# Patient Record
Sex: Female | Born: 1970
Health system: Southern US, Community
[De-identification: ages and names within clinical notes are randomized; demographics above are authoritative.]

## PROBLEM LIST (undated history)

## (undated) DIAGNOSIS — D219 Benign neoplasm of connective and other soft tissue, unspecified: Secondary | ICD-10-CM

## (undated) HISTORY — DX: Benign neoplasm of connective and other soft tissue, unspecified: D21.9

---

## 1994-02-10 HISTORY — PX: TOE SURGERY: SHX1073

## 2000-04-16 ENCOUNTER — Encounter: Admission: RE | Admit: 2000-04-16 | Discharge: 2000-05-26 | Payer: Self-pay | Admitting: *Deleted

## 2000-04-24 ENCOUNTER — Other Ambulatory Visit: Admission: RE | Admit: 2000-04-24 | Discharge: 2000-04-24 | Payer: Self-pay | Admitting: Gynecology

## 2001-03-03 ENCOUNTER — Encounter: Admission: RE | Admit: 2001-03-03 | Discharge: 2001-03-03 | Payer: Self-pay | Admitting: Gynecology

## 2001-03-21 ENCOUNTER — Encounter: Payer: Self-pay | Admitting: Gynecology

## 2001-03-21 ENCOUNTER — Observation Stay (HOSPITAL_COMMUNITY): Admission: AD | Admit: 2001-03-21 | Discharge: 2001-03-22 | Payer: Self-pay | Admitting: *Deleted

## 2001-05-26 ENCOUNTER — Inpatient Hospital Stay (HOSPITAL_COMMUNITY): Admission: AD | Admit: 2001-05-26 | Discharge: 2001-05-28 | Payer: Self-pay | Admitting: *Deleted

## 2001-07-08 ENCOUNTER — Other Ambulatory Visit: Admission: RE | Admit: 2001-07-08 | Discharge: 2001-07-08 | Payer: Self-pay | Admitting: *Deleted

## 2002-05-09 ENCOUNTER — Encounter: Admission: RE | Admit: 2002-05-09 | Discharge: 2002-05-09 | Payer: Self-pay | Admitting: Family Medicine

## 2002-05-09 ENCOUNTER — Encounter: Payer: Self-pay | Admitting: Family Medicine

## 2002-08-17 ENCOUNTER — Other Ambulatory Visit: Admission: RE | Admit: 2002-08-17 | Discharge: 2002-08-17 | Payer: Self-pay | Admitting: Obstetrics and Gynecology

## 2003-08-30 ENCOUNTER — Other Ambulatory Visit: Admission: RE | Admit: 2003-08-30 | Discharge: 2003-08-30 | Payer: Self-pay | Admitting: Gynecology

## 2004-02-27 ENCOUNTER — Encounter: Admission: RE | Admit: 2004-02-27 | Discharge: 2004-02-27 | Payer: Self-pay | Admitting: Family Medicine

## 2005-01-17 ENCOUNTER — Other Ambulatory Visit: Admission: RE | Admit: 2005-01-17 | Discharge: 2005-01-17 | Payer: Self-pay | Admitting: Gynecology

## 2006-01-20 ENCOUNTER — Other Ambulatory Visit: Admission: RE | Admit: 2006-01-20 | Discharge: 2006-01-20 | Payer: Self-pay | Admitting: Gynecology

## 2007-04-16 ENCOUNTER — Other Ambulatory Visit: Admission: RE | Admit: 2007-04-16 | Discharge: 2007-04-16 | Payer: Self-pay | Admitting: Gynecology

## 2008-04-21 ENCOUNTER — Other Ambulatory Visit: Admission: RE | Admit: 2008-04-21 | Discharge: 2008-04-21 | Payer: Self-pay | Admitting: Gynecology

## 2008-04-21 ENCOUNTER — Ambulatory Visit: Payer: Self-pay | Admitting: Women's Health

## 2008-04-21 ENCOUNTER — Encounter: Payer: Self-pay | Admitting: Women's Health

## 2008-04-24 ENCOUNTER — Ambulatory Visit: Payer: Self-pay | Admitting: Women's Health

## 2008-05-29 ENCOUNTER — Ambulatory Visit: Payer: Self-pay | Admitting: Women's Health

## 2008-05-30 ENCOUNTER — Ambulatory Visit: Payer: Self-pay | Admitting: Gynecology

## 2008-06-27 ENCOUNTER — Ambulatory Visit: Payer: Self-pay | Admitting: Gynecology

## 2009-04-25 ENCOUNTER — Other Ambulatory Visit: Admission: RE | Admit: 2009-04-25 | Discharge: 2009-04-25 | Payer: Self-pay | Admitting: Gynecology

## 2009-04-25 ENCOUNTER — Ambulatory Visit: Payer: Self-pay | Admitting: Women's Health

## 2009-10-11 ENCOUNTER — Ambulatory Visit: Payer: Self-pay | Admitting: Women's Health

## 2010-01-24 ENCOUNTER — Ambulatory Visit: Payer: Self-pay | Admitting: Women's Health

## 2010-06-28 NOTE — Discharge Summary (Signed)
[facility]  Patient:    Kathryn Farley, Kathryn Farley Visit Number: [id number] MRN: [id number]          Service Type: OBS Location: [id number] Attending Physician:  Wetzel Bjornstad Dictated by:   Antony Contras, [facility] Admit Date:  05/26/2001 Discharge Date: 05/28/2001                             Discharge Summary  DISCHARGE DIAGNOSES:          1. Intrauterine pregnancy at 39 weeks.                               2. Meconium stained fluid.  PROCEDURE:                    Normal spontaneous vaginal delivery of viable infant over intact perineum.  HISTORY OF PRESENT ILLNESS:   The patient is a 40 year old, gravida 2, para 1-0-0-1, LMP August 22, 2000, [facility] May 30, 2001.  Prenatal risk factors include a history of left lower quadrant pain for which the patient did receive evaluation during current pregnancy.  PRENATAL LABORATORY DATA:     Blood type is B positive.  Antibody screen negative.  RPR, HBSAG, and HIV nonreactive.  MSAFP normal.  HOSPITAL COURSE:              The patient was admitted at 39 weeks with spontaneous onset of labor.  There was some meconium stained fluid.  Delivery was accomplished spontaneously.  She delivered an Apgars 7 and 9 female infant weighing 6 pounds 9 ounces over intact perineum.  No meconium noted below the cords.  Postpartum course, she remained afebrile and had no difficulty voiding.  She was able to be discharged in satisfactory condition on her second postpartum day.  CBC; hematocrit 29.8, hemoglobin 9.9, WBC 10.8, and platelets 212.  DISPOSITION:                  Follow up in six weeks, continue prenatal vitamins and iron.  Motrin and Tylox for pain. Dictated by:   Antony Contras, [facility] Attending Physician:  Wetzel Bjornstad DD:  06/14/01 TD:  06/16/01 Job: [id number]

## 2010-08-23 ENCOUNTER — Other Ambulatory Visit (HOSPITAL_COMMUNITY)
Admission: RE | Admit: 2010-08-23 | Discharge: 2010-08-23 | Disposition: A | Discharge status: Home / Self Care | Payer: BC Managed Care – PPO | Source: Ambulatory Visit | Attending: Obstetrics and Gynecology | Admitting: Obstetrics and Gynecology

## 2010-08-23 ENCOUNTER — Other Ambulatory Visit: Payer: Self-pay | Admitting: Women's Health

## 2010-08-23 ENCOUNTER — Encounter (INDEPENDENT_AMBULATORY_CARE_PROVIDER_SITE_OTHER): Payer: BC Managed Care – PPO | Admitting: Women's Health

## 2010-08-23 DIAGNOSIS — Z833 Family history of diabetes mellitus: Secondary | ICD-10-CM

## 2010-08-23 DIAGNOSIS — Z01419 Encounter for gynecological examination (general) (routine) without abnormal findings: Secondary | ICD-10-CM

## 2010-08-23 DIAGNOSIS — Z13220 Encounter for screening for lipoid disorders: Secondary | ICD-10-CM

## 2010-08-23 DIAGNOSIS — Z124 Encounter for screening for malignant neoplasm of cervix: Secondary | ICD-10-CM | POA: Insufficient documentation

## 2010-08-27 ENCOUNTER — Encounter: Payer: Self-pay | Admitting: *Deleted

## 2011-10-09 ENCOUNTER — Encounter: Payer: Self-pay | Admitting: Women's Health

## 2011-10-09 ENCOUNTER — Other Ambulatory Visit: Payer: Self-pay | Admitting: Gynecology

## 2011-10-09 ENCOUNTER — Ambulatory Visit (INDEPENDENT_AMBULATORY_CARE_PROVIDER_SITE_OTHER): Payer: BC Managed Care – PPO | Admitting: Women's Health

## 2011-10-09 VITALS — BP 108/68 | Ht 66.5 in | Wt 157.0 lb

## 2011-10-09 DIAGNOSIS — D219 Benign neoplasm of connective and other soft tissue, unspecified: Secondary | ICD-10-CM | POA: Insufficient documentation

## 2011-10-09 DIAGNOSIS — D259 Leiomyoma of uterus, unspecified: Secondary | ICD-10-CM

## 2011-10-09 DIAGNOSIS — Z01419 Encounter for gynecological examination (general) (routine) without abnormal findings: Secondary | ICD-10-CM

## 2011-10-09 DIAGNOSIS — N946 Dysmenorrhea, unspecified: Secondary | ICD-10-CM

## 2011-10-09 DIAGNOSIS — Z833 Family history of diabetes mellitus: Secondary | ICD-10-CM

## 2011-10-09 LAB — CBC WITH DIFFERENTIAL/PLATELET
Basophils Absolute: 0 10*3/uL (ref 0.0–0.1)
Basophils Relative: 1 % (ref 0–1)
HCT: 36.4 % (ref 36.0–46.0)
Hemoglobin: 11.6 g/dL — ABNORMAL LOW (ref 12.0–15.0)
Lymphocytes Relative: 44 % (ref 12–46)
MCHC: 31.9 g/dL (ref 30.0–36.0)
Monocytes Relative: 5 % (ref 3–12)
Neutro Abs: 2.3 10*3/uL (ref 1.7–7.7)
Neutrophils Relative %: 49 % (ref 43–77)
WBC: 4.6 10*3/uL (ref 4.0–10.5)

## 2011-10-09 LAB — GLUCOSE, RANDOM: Glucose, Bld: 77 mg/dL (ref 70–99)

## 2011-10-09 MED ORDER — NAPROXEN SODIUM 550 MG PO TABS: 550.0000 mg | ORAL_TABLET | Freq: Two times a day (BID) | ORAL | Status: AC

## 2011-10-09 NOTE — Progress Notes (Signed)
Kathryn Farley 11-21-1970 [id number]    History:    The patient presents for annual exam.  Monthly 4-5 day cycle with dysmenorrhea/ relieved with Anaprox/vasectomy. History of normal Paps. History of normal baseline mammogram/overdue. Mother died of breast cancer at age 43 diagnosed at age 86.   Past medical history, past surgical history, family history and social history were all reviewed and documented in the EPIC chart. Son age 38 [place] doing well, daughter Kathryn Farley 10 doing well. 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 Had an excellent lipid panel 2012.   ROS:  A  ROS was performed and pertinent positives and negatives are included in the history.  Exam:  Filed Vitals:   10/09/11 1038  BP: 108/68    General appearance:  Normal Head/Neck:  Normal, without cervical or supraclavicular adenopathy. Thyroid:  Symmetrical, normal in size, without palpable masses or nodularity. Respiratory  Effort:  Normal  Auscultation:  Clear without wheezing or rhonchi Cardiovascular  Auscultation:  Regular rate, without rubs, murmurs or gallops  Edema/varicosities:  Not grossly evident Abdominal  Soft,nontender, without masses, guarding or rebound.  Liver/spleen:  No organomegaly noted  Hernia:  None appreciated  Skin  Inspection:  Grossly normal  Palpation:  Grossly normal Neurologic/psychiatric  Orientation:  Normal with appropriate conversation.  Mood/affect:  Normal  Genitourinary    Breasts: Examined lying and sitting.     Right: Without masses, retractions, discharge or axillary adenopathy.     Left: Without masses, retractions, discharge or axillary adenopathy.   Inguinal/mons:  Normal without inguinal adenopathy  External genitalia:  Normal  BUS/Urethra/Skene's glands:  Normal  Bladder:  Normal  Vagina:  Normal  Cervix:  Normal  Uterus:  Enlarged 12 weeks' size/fibroids.  Midline and mobile  Adnexa/parametria:     Rt: Without masses  or tenderness.   Lt: Without masses or tenderness.  Anus and perineum: Normal  Digital rectal exam: Normal sphincter tone without palpated masses or tenderness  Assessment/Plan:  41 y.o. MBF G2 P2 for annual exam with no complaints.  Fibroid uterus/dysmenorrhea Overdue for mammogram/mother with breast cancer  Plan: Reviewed importance of an annual screen, discussed 3D mammogram will schedule. SBE's, calcium rich diet, vitamin D 1000 daily, exercise encouraged. CBC, glucose, UA. No Pap, history of normal Paps, new screening guidelines reviewed. Anaprox prescription twice daily when necessary for dysmenorrhea given.    Kathryn Farley [facility], 12:50 PM 10/09/2011

## 2011-10-10 LAB — URINALYSIS W MICROSCOPIC + REFLEX CULTURE
Hgb urine dipstick: NEGATIVE
Leukocytes, UA: NEGATIVE
Nitrite: NEGATIVE
Protein, ur: NEGATIVE mg/dL
Squamous Epithelial / LPF: NONE SEEN

## 2011-10-17 ENCOUNTER — Encounter: Payer: Self-pay | Admitting: Women's Health

## 2011-10-20 ENCOUNTER — Other Ambulatory Visit: Payer: Self-pay | Admitting: Gynecology

## 2011-10-20 DIAGNOSIS — Z1231 Encounter for screening mammogram for malignant neoplasm of breast: Secondary | ICD-10-CM

## 2011-11-04 ENCOUNTER — Ambulatory Visit: Payer: BC Managed Care – PPO

## 2011-11-05 ENCOUNTER — Ambulatory Visit
Admission: RE | Admit: 2011-11-05 | Discharge: 2011-11-05 | Disposition: A | Discharge status: Home / Self Care | Payer: BC Managed Care – PPO | Source: Ambulatory Visit | Attending: Gynecology | Admitting: Gynecology

## 2011-11-05 DIAGNOSIS — Z1231 Encounter for screening mammogram for malignant neoplasm of breast: Secondary | ICD-10-CM

## 2011-11-20 ENCOUNTER — Telehealth: Payer: Self-pay | Admitting: *Deleted

## 2011-11-20 NOTE — Telephone Encounter (Signed)
Pt informed of recent normal mammogram results.

## 2012-08-23 ENCOUNTER — Encounter: Payer: Self-pay | Admitting: Women's Health

## 2012-08-23 ENCOUNTER — Ambulatory Visit (INDEPENDENT_AMBULATORY_CARE_PROVIDER_SITE_OTHER): Payer: BC Managed Care – PPO | Admitting: Women's Health

## 2012-08-23 DIAGNOSIS — N63 Unspecified lump in unspecified breast: Secondary | ICD-10-CM | POA: Insufficient documentation

## 2012-08-23 DIAGNOSIS — R928 Other abnormal and inconclusive findings on diagnostic imaging of breast: Secondary | ICD-10-CM

## 2012-08-23 NOTE — Patient Instructions (Addendum)
Breast Cyst A breast cyst is a sac in your breast that is filled with fluid. This is common in women. Women can have one or many cysts. When the breasts contain many cysts, it is usually due to a noncancerous (benign) condition called fibrocystic change. These lumps form under the influence of female hormones (estrogen and progesterone). The lumps are most often located in the upper, outer portion of the breast. They are often more swollen, painful, and tender before your period starts. They usually disappear after menopause, unless you are on hormone therapy. Different types of cysts:  Macrocysts. These are cysts that are about 2 inches (5.1 cm) in diameter.  Microcysts. These are tiny cysts that you cannot feel, but that are seen with a mammogram or an ultrasound.  Galactocele. This is a cyst containing milk, which develops when and if you suddenly stop breastfeeding.  Sebaceous cyst of the skin (not in the breast tissue itself). These are not cancerous. Breast cysts do not increase your chance of getting breast cancer. However, they must be followed and treated closely, because a cyst can be cancerous. Be sure to see your caregiver for follow-up care as recommended.  CAUSES   It is not completely known what causes a breast cyst.  Estrogen may influence the development of a breast cyst.  An overgrowth of milk glands and connective tissue in the breast can block the milk glands, causing them to fill with fluid.  Scar tissue in the breast from previous surgery may block the glands, causing a cyst. SYMPTOMS   Feeling a smooth, round, soft lump (like a grape) in the breast that is easily moveable.  Breast discomfort or pain, especially in the area of the cyst.  Increase in size of the lump before your menstrual period, and decrease in size after your menstrual period. DIAGNOSIS   The cyst can be felt during an exam by your caregiver.  Mammogram (breast X-ray).  Ultrasound.  Removing  fluid from the cyst with a needle (fine needle aspiration). TREATMENT   Your caregiver may feel there is no reason for treatment. He or she may watch to see if it goes away on its own.  Hormone treatment.  Needle aspiration. There is a 40% chance of the cyst recurring after aspiration.  Surgery to remove the whole cyst. HOME CARE INSTRUCTIONS   Get a yearly exam by your caregiver.  Practice "breast self-awareness." This means understanding the normal appearance and feel of your breasts and may include breast self-examination.  Have a clinical breast exam (CBE) by a caregiver every 1 to 3 years if you are 20 to 44 years of age. After age 40, you should have a CBE every year.  Get mammogram tests as directed by your caregiver.  Only take over-the-counter pain medicine as directed by your caregiver.  Wear a good support bra, especially when exercising.  Avoid caffeine.  Reduce your salt intake, especially before your menstrual period. Too much salt can cause fluid retention, breast swelling, and discomfort. SEEK MEDICAL CARE IF:   You feel, or think you feel, a lump in your breast.  You notice that both breasts look different than usual.  You notice that both breasts feel different than before.  Your breast is still causing pain, after your menstrual period is over.  You need medicine for breast pain and swelling that occurs with your menstrual period. SEEK IMMEDIATE MEDICAL CARE IF:   You develop severe pain, tenderness, redness, or warmth in your   breast.  You develop nipple discharge or bleeding.  Your breast lump becomes hard and painful.  You find new lumps or bumps that were not there before.  You feel lumps in your armpit (axilla).  You notice dimpling or wrinkling of the breast or nipple.  You have a fever. Document Released: 01/27/2005 Document Revised: 04/21/2011 Document Reviewed: 05/19/2008 ExitCare Patient Information 2014 [employer].  

## 2012-08-23 NOTE — Progress Notes (Signed)
Patient ID: Margy Clarks, female   DOB: 09-07-1970, 42 y.o.   MRN: [id number] Presents with complaint of breast lump noted within the past month. Normal mammogram 11/2011. Was visibly upset, states her sister died 3 weeks ago at age 54/stroke with no known health problems. No known breast cancer history. Denies injury to breast. Monthly cycle.  Exam: Breast exam and sitting and lying position no visible dimpling, retractions. Right breast no palpable nodules, left breast upper outer quadrant 2 cm firm mobile slightly tender nodule  tail of Spence.  Left breast nodule  Plan: Diagnostic mammogram. Will triage based on results. Reviewed possible biopsy.

## 2012-08-24 ENCOUNTER — Encounter: Payer: Self-pay | Admitting: Women's Health

## 2012-08-24 ENCOUNTER — Telehealth: Payer: Self-pay | Admitting: *Deleted

## 2012-08-24 DIAGNOSIS — N6320 Unspecified lump in the left breast, unspecified quadrant: Secondary | ICD-10-CM

## 2012-08-24 NOTE — Telephone Encounter (Signed)
Message copied by Aura Camps on Tue Aug 24, 2012  8:49 AM ------      Message from: [place], [place] J      Created: Mon Aug 23, 2012  4:35 PM       Please schedule diagnostic mammogram of left breast.  2cm mobile tender mass upper outer breast.  Maybe a month in duration. Last mammogram was at breast center 11-2011.  After 1:30 in afternoon best.            No family hx of breast ca  Mother questionable illness. ------

## 2012-08-24 NOTE — Telephone Encounter (Signed)
Orders placed, breast center will contact patient to schedule.

## 2012-08-25 NOTE — Telephone Encounter (Signed)
09/06/12 @ 3:20 pm

## 2012-08-30 ENCOUNTER — Ambulatory Visit
Admission: RE | Admit: 2012-08-30 | Discharge: 2012-08-30 | Disposition: A | Discharge status: Home / Self Care | Payer: BC Managed Care – PPO | Source: Ambulatory Visit | Attending: Women's Health | Admitting: Women's Health

## 2012-08-30 DIAGNOSIS — N6320 Unspecified lump in the left breast, unspecified quadrant: Secondary | ICD-10-CM

## 2012-09-06 ENCOUNTER — Other Ambulatory Visit: Payer: BC Managed Care – PPO

## 2012-10-14 ENCOUNTER — Encounter: Payer: BC Managed Care – PPO | Admitting: Women's Health

## 2012-11-02 ENCOUNTER — Other Ambulatory Visit (HOSPITAL_COMMUNITY)
Admission: RE | Admit: 2012-11-02 | Discharge: 2012-11-02 | Disposition: A | Discharge status: Home / Self Care | Payer: BC Managed Care – PPO | Source: Ambulatory Visit | Attending: Gynecology | Admitting: Gynecology

## 2012-11-02 ENCOUNTER — Ambulatory Visit (INDEPENDENT_AMBULATORY_CARE_PROVIDER_SITE_OTHER): Payer: BC Managed Care – PPO | Admitting: Women's Health

## 2012-11-02 ENCOUNTER — Encounter: Payer: Self-pay | Admitting: Women's Health

## 2012-11-02 VITALS — BP 110/78 | Ht 68.0 in | Wt 154.0 lb

## 2012-11-02 DIAGNOSIS — Z01419 Encounter for gynecological examination (general) (routine) without abnormal findings: Secondary | ICD-10-CM

## 2012-11-02 DIAGNOSIS — Z13220 Encounter for screening for lipoid disorders: Secondary | ICD-10-CM

## 2012-11-02 DIAGNOSIS — Z833 Family history of diabetes mellitus: Secondary | ICD-10-CM

## 2012-11-02 NOTE — Progress Notes (Signed)
Kathryn Farley 20-May-1970 [id number]    History:    The patient presents for annual exam with no complaints. Monthly cycle 5-6d/vasectomy. Describes cycles as often late or unpredictable, but doesn't have more than one every 30 days. Normal Pap and mammogram history. 09/2012 had ultrasound on nodule in left breast, benign cyst. History of uterine fibroids.  Past medical history, past surgical history, family history and social history were all reviewed and documented in the EPIC chart. Mother: Breast and ovarian cancer died age 40, father: Diabetes, sister died suddenly 08/26/22 from a stroke at age 50. 2 children, son 1, daughter 72. Volunteers as Surveyor, mining for infants, interested in going to nursing school.   ROS:  A  ROS was performed and pertinent positives and negatives are included in the history.  Exam:  Filed Vitals:   11/02/12 1414  BP: 110/78    General appearance:  Normal Head/Neck:  Normal, without cervical or supraclavicular adenopathy. Thyroid:  Symmetrical, normal in size, without palpable masses or nodularity. Respiratory  Effort:  Normal  Auscultation:  Clear without wheezing or rhonchi Cardiovascular  Auscultation:  Regular rate, without rubs, murmurs or gallops  Edema/varicosities:  Not grossly evident Abdominal  Soft,nontender, without masses, guarding or rebound.  Liver/spleen:  No organomegaly noted  Hernia:  None appreciated  Skin  Inspection:  Grossly normal  Palpation:  Grossly normal Neurologic/psychiatric  Orientation:  Normal with appropriate conversation.  Mood/affect:  Normal  Genitourinary    Breasts: Examined lying and sitting.     Right: Without masses, retractions, discharge or axillary adenopathy.     Left: Without masses, retractions, discharge or axillary adenopathy.   Inguinal/mons:  Normal without inguinal adenopathy  External genitalia:  Normal  BUS/Urethra/Skene's glands:  Normal  Bladder:  Normal  Vagina:   Normal  Cervix:  Normal  Uterus: Retroverted 8 weeks size/fibroids   Midline and mobile  Adnexa/parametria:     Rt: Without masses or tenderness.   Lt: Without masses or tenderness.  Anus and perineum: Normal  Digital rectal exam: Normal sphincter tone without palpated masses or tenderness  Assessment/Plan:  42 y.o.  G2P2 MBF for annual exam.   Asymptomatic Fibroids /vasectomy  Plan: Pap, normal Pap 2012, new screening guidelines reviewed, CBC, lipid panel, glucose, UA. Continue annual mammogram, recommended 3D tomography due to dense breasts. SBE's, MVI, heart healthy eating/regular exercise encouraged.  Harrington Challenger [facility], 2:57 PM 11/02/2012

## 2012-11-02 NOTE — Patient Instructions (Signed)

## 2012-11-03 LAB — CBC WITH DIFFERENTIAL/PLATELET
Basophils Absolute: 0 10*3/uL (ref 0.0–0.1)
Basophils Relative: 0 % (ref 0–1)
Eosinophils Absolute: 0 10*3/uL (ref 0.0–0.7)
Eosinophils Relative: 0 % (ref 0–5)
HCT: 35.5 % — ABNORMAL LOW (ref 36.0–46.0)
MCH: 25.5 pg — ABNORMAL LOW (ref 26.0–34.0)
MCHC: 32.1 g/dL (ref 30.0–36.0)
MCV: 79.4 fL (ref 78.0–100.0)
Monocytes Absolute: 0.3 10*3/uL (ref 0.1–1.0)
Neutro Abs: 3.6 10*3/uL (ref 1.7–7.7)
RDW: 18.9 % — ABNORMAL HIGH (ref 11.5–15.5)

## 2012-11-03 LAB — GLUCOSE, RANDOM: Glucose, Bld: 78 mg/dL (ref 70–99)

## 2012-11-03 LAB — URINALYSIS W MICROSCOPIC + REFLEX CULTURE
Bacteria, UA: NONE SEEN
Bilirubin Urine: NEGATIVE
Glucose, UA: NEGATIVE mg/dL
Ketones, ur: NEGATIVE mg/dL
Protein, ur: NEGATIVE mg/dL
Urobilinogen, UA: 0.2 mg/dL (ref 0.0–1.0)

## 2012-11-03 LAB — LIPID PANEL
HDL: 76 mg/dL (ref >39)
Triglycerides: 76 mg/dL (ref <150)

## 2012-11-03 LAB — URINE CULTURE
Colony Count: NO GROWTH
Organism ID, Bacteria: NO GROWTH

## 2012-12-16 ENCOUNTER — Other Ambulatory Visit: Payer: Self-pay

## 2013-12-12 ENCOUNTER — Encounter: Payer: Self-pay | Admitting: Women's Health

## 2013-12-21 ENCOUNTER — Encounter: Payer: Self-pay | Admitting: Women's Health

## 2013-12-21 ENCOUNTER — Ambulatory Visit (INDEPENDENT_AMBULATORY_CARE_PROVIDER_SITE_OTHER): Payer: BC Managed Care – PPO | Admitting: Women's Health

## 2013-12-21 VITALS — BP 128/74 | Ht 68.0 in | Wt 164.0 lb

## 2013-12-21 DIAGNOSIS — Z01419 Encounter for gynecological examination (general) (routine) without abnormal findings: Secondary | ICD-10-CM

## 2013-12-21 DIAGNOSIS — Z833 Family history of diabetes mellitus: Secondary | ICD-10-CM

## 2013-12-21 LAB — CBC WITH DIFFERENTIAL/PLATELET
Basophils Absolute: 0 10*3/uL (ref 0.0–0.1)
Basophils Relative: 1 % (ref 0–1)
Eosinophils Absolute: 0.1 10*3/uL (ref 0.0–0.7)
Eosinophils Relative: 2 % (ref 0–5)
HEMATOCRIT: 37.4 % (ref 36.0–46.0)
HEMOGLOBIN: 12.2 g/dL (ref 12.0–15.0)
Lymphocytes Relative: 50 % — ABNORMAL HIGH (ref 12–46)
Lymphs Abs: 2.2 10*3/uL (ref 0.7–4.0)
MCH: 27.6 pg (ref 26.0–34.0)
MCHC: 32.6 g/dL (ref 30.0–36.0)
MCV: 84.6 fL (ref 78.0–100.0)
MONO ABS: 0.2 10*3/uL (ref 0.1–1.0)
MONOS PCT: 5 % (ref 3–12)
NEUTROS ABS: 1.8 10*3/uL (ref 1.7–7.7)
Neutrophils Relative %: 42 % — ABNORMAL LOW (ref 43–77)
Platelets: 232 10*3/uL (ref 150–400)
RBC: 4.42 MIL/uL (ref 3.87–5.11)
RDW: 14.7 % (ref 11.5–15.5)
WBC: 4.4 10*3/uL (ref 4.0–10.5)

## 2013-12-21 LAB — GLUCOSE, RANDOM: Glucose, Bld: 84 mg/dL (ref 70–99)

## 2013-12-21 NOTE — Progress Notes (Signed)
Kathryn Farley January 04, 1971 [id number]    History:    Presents for annual exam.  Monthly 5-6 day cycle vasectomy history of fibroids. History of a breast cysts with stable ultrasounds. Normal Pap history. Mother breast and ovarian cancer died age 45.   Past medical history, past surgical history, family history and social history were all reviewed and documented in the EPIC chart. 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 Father diabetes. Sister died age 73 from a stroke.  ROS:  A  12 point ROS was performed and pertinent positives and negatives are included.  Exam:  Filed Vitals:   12/21/13 1140  BP: 128/74    General appearance:  Normal Thyroid:  Symmetrical, normal in size, without palpable masses or nodularity. Respiratory  Auscultation:  Clear without wheezing or rhonchi Cardiovascular  Auscultation:  Regular rate, without rubs, murmurs or gallops  Edema/varicosities:  Not grossly evident Abdominal  Soft,nontender, without masses, guarding or rebound.  Liver/spleen:  No organomegaly noted  Hernia:  None appreciated  Skin  Inspection:  Grossly normal   Breasts: Examined lying and sitting.     Right: Without masses, retractions, discharge or axillary adenopathy.     Left: Without masses, retractions, discharge or axillary adenopathy. Gentitourinary   Inguinal/mons:  Normal without inguinal adenopathy  External genitalia:  Normal  BUS/Urethra/Skene's glands:  Normal  Vagina:  Normal  Cervix:  Normal  Uterus:  normal in size, shape and contour.  Midline and mobile  Adnexa/parametria:     Rt: Without masses or tenderness.   Lt: Without masses or tenderness.  Anus and perineum: Normal  Digital rectal exam: Normal sphincter tone without palpated masses or tenderness  Assessment/Plan:  43 y.o.MBF G2P2  for annual examwith no complaints.  Monthly cycle/vasectomy Asymptomatic fibroids  Plan: SBE's, continue annual mammogram 3-D tomography  reviewed and encouraged history of dense breasts. Continue regular exercise, active lifestyle, calcium rich diet, vitamin D 1000 daily encouraged. CBC, glucose, UA, Pap normal 2014, new screening guidelines reviewed. (Excellent lipid panel 2014.)   Huel Cote WHNP, 1:30 PM 12/21/2013

## 2013-12-21 NOTE — Patient Instructions (Signed)

## 2013-12-22 ENCOUNTER — Encounter: Payer: Self-pay | Admitting: Women's Health

## 2013-12-22 LAB — URINALYSIS W MICROSCOPIC + REFLEX CULTURE
Bilirubin Urine: NEGATIVE
CRYSTALS: NONE SEEN
Casts: NONE SEEN
Glucose, UA: NEGATIVE mg/dL
Hgb urine dipstick: NEGATIVE
Ketones, ur: NEGATIVE mg/dL
LEUKOCYTES UA: NEGATIVE
NITRITE: NEGATIVE
Protein, ur: NEGATIVE mg/dL
SPECIFIC GRAVITY, URINE: 1.019 (ref 1.005–1.030)
SQUAMOUS EPITHELIAL / LPF: NONE SEEN
UROBILINOGEN UA: 0.2 mg/dL (ref 0.0–1.0)
pH: 7.5 (ref 5.0–8.0)

## 2014-12-01 ENCOUNTER — Encounter: Payer: Self-pay | Admitting: Women's Health

## 2014-12-01 ENCOUNTER — Ambulatory Visit (INDEPENDENT_AMBULATORY_CARE_PROVIDER_SITE_OTHER): Payer: BLUE CROSS/BLUE SHIELD | Admitting: Women's Health

## 2014-12-01 VITALS — BP 116/78

## 2014-12-01 DIAGNOSIS — N841 Polyp of cervix uteri: Secondary | ICD-10-CM

## 2014-12-01 NOTE — Progress Notes (Signed)
Patient ID: Rolene Course, female   DOB: [birth year], 44 y.o.   MRN: [id number] Presents with complaint of bright red spotting for the past 2 weeks. First time this has occurred, monthly cycles vasectomy. Denies abdominal pain, vaginal discharge, urinary symptoms, or fever.   Exam: Appears well. External genitalia within normal limits, speculum exam 2 cm cervical polyp noted, removed intact with ring forcep, no bleeding after.  Cervical polyp  Plan: Reviewed most likely cause of new onset spotting, reviewed if bleeding/irregular spotting continues proceed with sonohysterogram with Dr. Phineas Real. Will watch at this time. Polyp sent for biopsy.

## 2014-12-05 ENCOUNTER — Encounter: Payer: Self-pay | Admitting: Women's Health

## 2015-03-28 ENCOUNTER — Ambulatory Visit (INDEPENDENT_AMBULATORY_CARE_PROVIDER_SITE_OTHER): Payer: BLUE CROSS/BLUE SHIELD | Admitting: Women's Health

## 2015-03-28 ENCOUNTER — Other Ambulatory Visit (HOSPITAL_COMMUNITY)
Admission: RE | Admit: 2015-03-28 | Discharge: 2015-03-28 | Disposition: A | Discharge status: Home / Self Care | Payer: BLUE CROSS/BLUE SHIELD | Source: Ambulatory Visit | Attending: Women's Health | Admitting: Women's Health

## 2015-03-28 ENCOUNTER — Encounter: Payer: Self-pay | Admitting: Women's Health

## 2015-03-28 VITALS — BP 118/80 | Ht 68.0 in | Wt 165.0 lb

## 2015-03-28 DIAGNOSIS — Z1151 Encounter for screening for human papillomavirus (HPV): Secondary | ICD-10-CM | POA: Diagnosis present

## 2015-03-28 DIAGNOSIS — N912 Amenorrhea, unspecified: Secondary | ICD-10-CM | POA: Diagnosis not present

## 2015-03-28 DIAGNOSIS — Z01419 Encounter for gynecological examination (general) (routine) without abnormal findings: Secondary | ICD-10-CM | POA: Insufficient documentation

## 2015-03-28 NOTE — Progress Notes (Signed)
Kathryn Farley Oct 20, 1970 [id number]    History:    Presents for annual exam.  History of regular 5-6 day cycles/vasectomy, past year occasional missed cycles, last cycle December 2016. Denies any menopausal symptoms. History of a right breast cyst confirmed on ultrasound 2014 overdue for mammogram. Mother ovarian cancer died at age 47.   Past medical history, past surgical history, family history and social history were all reviewed and documented in the EPIC chart. Works at [employer], son graduated from college doing well, daughter 82 doing well. Sister died of a stroke at age 73, niece struggling making poor choices and her great niece age 47 is having difficulty in school. Father diabetes.  ROS:  A ROS was performed and pertinent positives and negatives are included.  Exam:  Filed Vitals:   03/28/15 0811  BP: 118/80    General appearance:  Normal Thyroid:  Symmetrical, normal in size, without palpable masses or nodularity. Respiratory  Auscultation:  Clear without wheezing or rhonchi Cardiovascular  Auscultation:  Regular rate, without rubs, murmurs or gallops  Edema/varicosities:  Not grossly evident Abdominal  Soft,nontender, without masses, guarding or rebound.  Liver/spleen:  No organomegaly noted  Hernia:  None appreciated  Skin  Inspection:  Grossly normal   Breasts: Examined lying and sitting.     Right: Without masses, retractions, discharge or axillary adenopathy.     Left: Without masses, retractions, discharge or axillary adenopathy. Gentitourinary   Inguinal/mons:  Normal without inguinal adenopathy  External genitalia:  Normal  BUS/Urethra/Skene's glands:  Normal  Vagina:  Normal  Cervix:  Normal  Uterus:  normal in size, shape and contour.  Midline and mobile  Adnexa/parametria:     Rt: Without masses or tenderness.   Lt: Without masses or tenderness.  Anus and perineum: Normal  Digital rectal exam: Normal sphincter tone without palpated masses or  tenderness  Assessment/Plan:  45 y.o. MBF G2 P2 for annual exam with no complaints.  Irregular cycles/perimenopausal/vasectomy Small fibroids - asymptomatic Situational stress -extended family issues  Plan: SBE's, overdue for mammogram, instructed to schedule ASAP. Continue active lifestyle, regular exercise, calcium rich diet, vitamin D 1000 daily encouraged. Reports normal labs at health screening at work, [facility], vitamin D, UA, Pap with HR HPV typing, new screening guidelines reviewed.  Huel Cote [facility], 9:01 AM 03/28/2015

## 2015-03-28 NOTE — Addendum Note (Signed)
Addended by: Burnett Kanaris on: 03/28/2015 09:17 AM   Modules accepted: Orders

## 2015-03-28 NOTE — Patient Instructions (Addendum)
Menopause is a normal process in which your reproductive ability comes to an end. This process happens gradually over a span of months to years, usually between the ages of 48 and 55. Menopause is complete when you have missed 12 consecutive menstrual periods. It is important to talk with your health care provider about some of the most common conditions that affect postmenopausal women, such as heart disease, cancer, and bone loss (osteoporosis). Adopting a healthy lifestyle and getting preventive care can help to promote your health and wellness. Those actions can also lower your chances of developing some of these common conditions. WHAT SHOULD I KNOW ABOUT MENOPAUSE? During menopause, you may experience a number of symptoms, such as:  Moderate-to-severe hot flashes.  Night sweats.  Decrease in sex drive.  Mood swings.  Headaches.  Tiredness.  Irritability.  Memory problems.  Insomnia. Choosing to treat or not to treat menopausal changes is an individual decision that you make with your health care provider. WHAT SHOULD I KNOW ABOUT HORMONE REPLACEMENT THERAPY AND SUPPLEMENTS? Hormone therapy products are effective for treating symptoms that are associated with menopause, such as hot flashes and night sweats. Hormone replacement carries certain risks, especially as you become older. If you are thinking about using estrogen or estrogen with progestin treatments, discuss the benefits and risks with your health care provider. WHAT SHOULD I KNOW ABOUT HEART DISEASE AND STROKE? Heart disease, heart attack, and stroke become more likely as you age. This may be due, in part, to the hormonal changes that your body experiences during menopause. These can affect how your body processes dietary fats, triglycerides, and cholesterol. Heart attack and stroke are both medical emergencies. There are many things that you can do to help prevent heart disease and stroke:  Have your blood pressure  checked at least every 1-2 years. High blood pressure causes heart disease and increases the risk of stroke.  If you are 55-79 years old, ask your health care provider if you should take aspirin to prevent a heart attack or a stroke.  Do not use any tobacco products, including cigarettes, chewing tobacco, or electronic cigarettes. If you need help quitting, ask your health care provider.  It is important to eat a healthy diet and maintain a healthy weight.  Be sure to include plenty of vegetables, fruits, low-fat dairy products, and lean protein.  Avoid eating foods that are high in solid fats, added sugars, or salt (sodium).  Get regular exercise. This is one of the most important things that you can do for your health.  Try to exercise for at least 150 minutes each week. The type of exercise that you do should increase your heart rate and make you sweat. This is known as moderate-intensity exercise.  Try to do strengthening exercises at least twice each week. Do these in addition to the moderate-intensity exercise.  Know your numbers.Ask your health care provider to check your cholesterol and your blood glucose. Continue to have your blood tested as directed by your health care provider. WHAT SHOULD I KNOW ABOUT CANCER SCREENING? There are several types of cancer. Take the following steps to reduce your risk and to catch any cancer development as early as possible. Breast Cancer  Practice breast self-awareness.  This means understanding how your breasts normally appear and feel.  It also means doing regular breast self-exams. Let your health care provider know about any changes, no matter how small.  If you are 40 or older, have a clinician do a   breast exam (clinical breast exam or CBE) every year. Depending on your age, family history, and medical history, it may be recommended that you also have a yearly breast X-ray (mammogram).  If you have a family history of breast cancer,  talk with your health care provider about genetic screening.  If you are at high risk for breast cancer, talk with your health care provider about having an MRI and a mammogram every year.  Breast cancer (BRCA) gene test is recommended for women who have family members with BRCA-related cancers. Results of the assessment will determine the need for genetic counseling and BRCA1 and for BRCA2 testing. BRCA-related cancers include these types:  Breast. This occurs in males or females.  Ovarian.  Tubal. This may also be called fallopian tube cancer.  Cancer of the abdominal or pelvic lining (peritoneal cancer).  Prostate.  Pancreatic. Cervical, Uterine, and Ovarian Cancer Your health care provider may recommend that you be screened regularly for cancer of the pelvic organs. These include your ovaries, uterus, and vagina. This screening involves a pelvic exam, which includes checking for microscopic changes to the surface of your cervix (Pap test).  For women ages 21-65, health care providers may recommend a pelvic exam and a Pap test every three years. For women ages 77-65, they may recommend the Pap test and pelvic exam, combined with testing for human papilloma virus (HPV), every five years. Some types of HPV increase your risk of cervical cancer. Testing for HPV may also be done on women of any age who have unclear Pap test results.  Other health care providers may not recommend any screening for nonpregnant women who are considered low risk for pelvic cancer and have no symptoms. Ask your health care provider if a screening pelvic exam is right for you.  If you have had past treatment for cervical cancer or a condition that could lead to cancer, you need Pap tests and screening for cancer for at least 20 years after your treatment. If Pap tests have been discontinued for you, your risk factors (such as having a new sexual partner) need to be reassessed to determine if you should start having  screenings again. Some women have medical problems that increase the chance of getting cervical cancer. In these cases, your health care provider may recommend that you have screening and Pap tests more often.  If you have a family history of uterine cancer or ovarian cancer, talk with your health care provider about genetic screening.  If you have vaginal bleeding after reaching menopause, tell your health care provider.  There are currently no reliable tests available to screen for ovarian cancer. Lung Cancer Lung cancer screening is recommended for adults 3-70 years old who are at high risk for lung cancer because of a history of smoking. A yearly low-dose CT scan of the lungs is recommended if you:  Currently smoke.  Have a history of at least 30 pack-years of smoking and you currently smoke or have quit within the past 15 years. A pack-year is smoking an average of one pack of cigarettes per day for one year. Yearly screening should:  Continue until it has been 15 years since you quit.  Stop if you develop a health problem that would prevent you from having lung cancer treatment. Colorectal Cancer  This type of cancer can be detected and can often be prevented.  Routine colorectal cancer screening usually begins at age 38 and continues through age 12.  If you have  risk factors for colon cancer, your health care provider may recommend that you be screened at an earlier age.  If you have a family history of colorectal cancer, talk with your health care provider about genetic screening.  Your health care provider may also recommend using home test kits to check for hidden blood in your stool.  A small camera at the end of a tube can be used to examine your colon directly (sigmoidoscopy or colonoscopy). This is done to check for the earliest forms of colorectal cancer.  Direct examination of the colon should be repeated every 5-10 years until age 67. However, if early forms of  precancerous polyps or small growths are found or if you have a family history or genetic risk for colorectal cancer, you may need to be screened more often. Skin Cancer  Check your skin from head to toe regularly.  Monitor any moles. Be sure to tell your health care provider:  About any new moles or changes in moles, especially if there is a change in a mole's shape or color.  If you have a mole that is larger than the size of a pencil eraser.  If any of your family members has a history of skin cancer, especially at a young age, talk with your health care provider about genetic screening.  Always use sunscreen. Apply sunscreen liberally and repeatedly throughout the day.  Whenever you are outside, protect yourself by wearing long sleeves, pants, a wide-brimmed hat, and sunglasses. WHAT SHOULD I KNOW ABOUT OSTEOPOROSIS? Osteoporosis is a condition in which bone destruction happens more quickly than new bone creation. After menopause, you may be at an increased risk for osteoporosis. To help prevent osteoporosis or the bone fractures that can happen because of osteoporosis, the following is recommended:  If you are 39-61 years old, get at least 1,000 mg of calcium and at least 600 mg of vitamin D per day.  If you are older than age 16 but younger than age 7, get at least 1,200 mg of calcium and at least 600 mg of vitamin D per day.  If you are older than age 47, get at least 1,200 mg of calcium and at least 800 mg of vitamin D per day. Smoking and excessive alcohol intake increase the risk of osteoporosis. Eat foods that are rich in calcium and vitamin D, and do weight-bearing exercises several times each week as directed by your health care provider. WHAT SHOULD I KNOW ABOUT HOW MENOPAUSE AFFECTS Kathryn Farley? Depression may occur at any age, but it is more common as you become older. Common symptoms of depression include:  Low or sad mood.  Changes in sleep patterns.  Changes  in appetite or eating patterns.  Feeling an overall lack of motivation or enjoyment of activities that you previously enjoyed.  Frequent crying spells. Talk with your health care provider if you think that you are experiencing depression. WHAT SHOULD I KNOW ABOUT IMMUNIZATIONS? It is important that you get and maintain your immunizations. These include:  Tetanus, diphtheria, and pertussis (Tdap) booster vaccine.  Influenza every year before the flu season begins.  Pneumonia vaccine.  Shingles vaccine. Your health care provider may also recommend other immunizations.   This information is not intended to replace advice given to you by your health care provider. Make sure you discuss any questions you have with your health care provider.   Document Released: 03/21/2005 Document Revised: 02/17/2014 Document Reviewed: 09/29/2013 Elsevier Interactive Patient Education 2016 Else[employer] Human Papillomavirus Quadrivalent Vaccine suspension for injection What is this medicine? HUMAN PAPILLOMAVIRUS VACCINE (HYOO muhn pap uh LOH muh vahy ruhs vak SEEN) is a vaccine. It is used to prevent infections of four types of the human papillomavirus. In women, the vaccine may lower your risk of getting cervical, vaginal, vulvar, or anal cancer and genital warts. In men, the vaccine may lower your risk of getting genital warts and anal cancer. You cannot get these diseases from the vaccine. This vaccine does not treat these diseases. This medicine may be used for other purposes; ask your health care provider or pharmacist if you have questions. What should I tell my health care provider before I take this medicine? They need to know if you have any of these conditions: -fever or infection -hemophilia -HIV infection or AIDS -immune system problems -low platelet count -an unusual reaction to Human Papillomavirus Vaccine, yeast, other medicines, foods, dyes, or preservatives -pregnant or trying to get  pregnant -breast-feeding How should I use this medicine? This vaccine is for injection in a muscle on your upper arm or thigh. It is given by a health care professional. Kathryn Farley will be observed for 15 minutes after each dose. Sometimes, fainting happens after the vaccine is given. You may be asked to sit or lie down during the 15 minutes. Three doses are given. The second dose is given 2 months after the first dose. The last dose is given 4 months after the second dose. A copy of a Vaccine Information Statement will be given before each vaccination. Read this sheet carefully each time. The sheet may change frequently. Talk to your pediatrician regarding the use of this medicine in children. While this drug may be prescribed for children as young as 58 years of age for selected conditions, precautions do apply. Overdosage: If you think you have taken too much of this medicine contact a poison control center or emergency room at once. NOTE: This medicine is only for you. Do not share this medicine with others. What if I miss a dose? All 3 doses of the vaccine should be given within 6 months. Remember to keep appointments for follow-up doses. Your health care provider will tell you when to return for the next vaccine. Ask your health care professional for advice if you are unable to keep an appointment or miss a scheduled dose. What may interact with this medicine? -other vaccines This list may not describe all possible interactions. Give your health care provider a list of all the medicines, herbs, non-prescription drugs, or dietary supplements you use. Also tell them if you smoke, drink alcohol, or use illegal drugs. Some items may interact with your medicine. What should I watch for while using this medicine? This vaccine may not fully protect everyone. Continue to have regular pelvic exams and cervical or anal cancer screenings as directed by your doctor. The Human Papillomavirus is a sexually  transmitted disease. It can be passed by any kind of sexual activity that involves genital contact. The vaccine works best when given before you have any contact with the virus. Many people who have the virus do not have any signs or symptoms. Tell your doctor or health care professional if you have any reaction or unusual symptom after getting the vaccine. What side effects may I notice from receiving this medicine? Side effects that you should report to your doctor or health care professional as soon as possible: -allergic reactions like skin rash, itching or hives, swelling of the  face, lips, or tongue -breathing problems -feeling faint or lightheaded, falls Side effects that usually do not require medical attention (report to your doctor or health care professional if they continue or are bothersome): -cough -dizziness -fever -headache -nausea -redness, warmth, swelling, pain, or itching at site where injected This list may not describe all possible side effects. Call your doctor for medical advice about side effects. You may report side effects to FDA at 1-800-FDA-1088. Where should I keep my medicine? This drug is given in a hospital or clinic and will not be stored at home. NOTE: This sheet is a summary. It may not cover all possible information. If you have questions about this medicine, talk to your doctor, pharmacist, or health care provider.    2016, Elsevier/Gold Standard. (2013-03-21 13:14:33)

## 2015-03-29 LAB — URINALYSIS W MICROSCOPIC + REFLEX CULTURE
BILIRUBIN URINE: NEGATIVE
Bacteria, UA: NONE SEEN [HPF]
Casts: NONE SEEN [LPF]
Crystals: NONE SEEN [HPF]
GLUCOSE, UA: NEGATIVE
Hgb urine dipstick: NEGATIVE
Ketones, ur: NEGATIVE
LEUKOCYTES UA: NEGATIVE
NITRITE: NEGATIVE
PH: 5.5 (ref 5.0–8.0)
PROTEIN: NEGATIVE
Specific Gravity, Urine: 1.022 (ref 1.001–1.035)
Squamous Epithelial / LPF: NONE SEEN [HPF] (ref <=5)
WBC UA: NONE SEEN WBC/HPF (ref <=5)
Yeast: NONE SEEN [HPF]

## 2015-03-29 LAB — FOLLICLE STIMULATING HORMONE: FSH: 124.5 m[IU]/mL — ABNORMAL HIGH

## 2015-03-29 LAB — CYTOLOGY - PAP

## 2015-03-29 LAB — VITAMIN D 25 HYDROXY (VIT D DEFICIENCY, FRACTURES): Vit D, 25-Hydroxy: 31 ng/mL (ref 30–100)

## 2015-03-30 LAB — URINE CULTURE

## 2015-07-16 ENCOUNTER — Other Ambulatory Visit: Payer: Self-pay | Admitting: Women's Health

## 2015-07-16 ENCOUNTER — Ambulatory Visit
Admission: RE | Admit: 2015-07-16 | Discharge: 2015-07-16 | Disposition: A | Discharge status: Home / Self Care | Payer: BLUE CROSS/BLUE SHIELD | Source: Ambulatory Visit | Attending: Women's Health | Admitting: Women's Health

## 2015-07-16 DIAGNOSIS — Z1231 Encounter for screening mammogram for malignant neoplasm of breast: Secondary | ICD-10-CM

## 2015-07-23 ENCOUNTER — Encounter: Payer: Self-pay | Admitting: Women's Health

## 2015-07-23 ENCOUNTER — Ambulatory Visit: Payer: BLUE CROSS/BLUE SHIELD

## 2015-07-27 ENCOUNTER — Other Ambulatory Visit: Payer: Self-pay | Admitting: Orthopedic Surgery

## 2015-09-29 ENCOUNTER — Encounter (HOSPITAL_BASED_OUTPATIENT_CLINIC_OR_DEPARTMENT_OTHER): Payer: Self-pay | Admitting: *Deleted

## 2015-09-29 ENCOUNTER — Emergency Department (HOSPITAL_BASED_OUTPATIENT_CLINIC_OR_DEPARTMENT_OTHER)
Admission: EM | Admit: 2015-09-29 | Discharge: 2015-09-29 | Disposition: A | Discharge status: Home / Self Care | Payer: BLUE CROSS/BLUE SHIELD | Attending: Emergency Medicine | Admitting: Emergency Medicine

## 2015-09-29 ENCOUNTER — Emergency Department (HOSPITAL_BASED_OUTPATIENT_CLINIC_OR_DEPARTMENT_OTHER): Payer: BLUE CROSS/BLUE SHIELD

## 2015-09-29 DIAGNOSIS — B349 Viral infection, unspecified: Secondary | ICD-10-CM | POA: Insufficient documentation

## 2015-09-29 DIAGNOSIS — R509 Fever, unspecified: Secondary | ICD-10-CM | POA: Diagnosis present

## 2015-09-29 MED ORDER — BENZONATATE 100 MG PO CAPS: 100.0000 mg | ORAL_CAPSULE | Freq: Three times a day (TID) | ORAL | 0 refills | Status: DC

## 2015-09-29 MED ORDER — IBUPROFEN 600 MG PO TABS: 600.0000 mg | ORAL_TABLET | Freq: Three times a day (TID) | ORAL | 0 refills | Status: DC | PRN

## 2015-09-29 NOTE — Discharge Instructions (Signed)
Prescriptions are to help with the symptoms your experiencing. He can also take over-the-counter Tylenol. Follow-up with your primary doctor next week if symptoms have not resolved. Return as needed for worsening symptoms.

## 2015-09-29 NOTE — ED Triage Notes (Signed)
Patient c/o low grade fever and productive cough since Wednesday. Patient states that she went on a trip to the [place] & is concerned since she had some mosquito bites while there. She states her temperature at home ranged from 99 - 100. She too nyquil & mucinex, but continues to have body aches.

## 2015-09-29 NOTE — ED Provider Notes (Signed)
[place] [facility] Provider Note   CSN: [id number] Arrival date & time: 09/29/15  0931     History   Chief Complaint Chief Complaint  Patient presents with  . Fever  . Generalized Body Aches    HPI Kathryn Farley is a 45 y.o. female.  HPI The patient presented to the emergency room with complaints of fever and body aches. Patient recently traveled to the [place]. About 5 days ago she started experiencing URI symptoms of mild cough and some nasal congestion. She thought it could be related to allergies and has tried taking antihistamines. She has developed diffuse body aches. She is not able to sleep comfortably at night because of her myalgias as well as congestion. She's had some low-grade temperatures of 99-100. She denies any trouble with abdominal pain. No sore throat. She denies any rashes. No vomiting or diarrhea. Past Medical History:  Diagnosis Date  . Fibroids     Patient Active Problem List   Diagnosis Date Noted  . Breast nodule 08/23/2012  . Fibroids 10/09/2011    Past Surgical History:  Procedure Laterality Date  . TOE SURGERY  1996    OB History    Gravida Para Term Preterm AB Living   2 2 2     2    SAB TAB Ectopic Multiple Live Births           2       Home Medications    Prior to Admission medications   Medication Sig Start Date End Date Taking? Authorizing Provider  benzonatate (TESSALON) 100 MG capsule Take 1 capsule (100 mg total) by mouth every 8 (eight) hours. 09/29/15   Dorie Rank, MD  ibuprofen (ADVIL,MOTRIN) 600 MG tablet Take 1 tablet (600 mg total) by mouth every 8 (eight) hours as needed. 09/29/15   Dorie Rank, MD    Family History Family History  Problem Relation Age of Onset  . Breast cancer Mother   . Ovarian cancer Mother 43  . Stroke Sister 57  . Diabetes Father     Social History Social History  Substance Use Topics  . Smoking status: Never Smoker  . Smokeless tobacco: Never Used  . Alcohol use No      Allergies   Fluconazole in dextrose and Metronidazole   Review of Systems Review of Systems  All other systems reviewed and are negative.    Physical Exam Updated Vital Signs BP 120/76 (BP Location: Right Arm)   Pulse 88   Temp 98.2 F (36.8 C) (Oral)   Resp 18   Ht 5\' 8"  (1.727 m)   Wt 77.1 kg   LMP 08/29/2015 (Within Days)   SpO2 98%   BMI 25.85 kg/m   Physical Exam  Constitutional: She appears well-developed and well-nourished. No distress.  HENT:  Head: Normocephalic and atraumatic.  Right Ear: External ear normal.  Left Ear: External ear normal.  Eyes: Conjunctivae are normal. Right eye exhibits no discharge. Left eye exhibits no discharge. No scleral icterus.  Neck: Neck supple. No tracheal deviation present.  Cardiovascular: Normal rate, regular rhythm and intact distal pulses.   Pulmonary/Chest: Effort normal and breath sounds normal. No stridor. No respiratory distress. She has no wheezes. She has no rales.  Abdominal: Soft. Bowel sounds are normal. She exhibits no distension. There is no tenderness. There is no rebound and no guarding.  Musculoskeletal: She exhibits no edema or tenderness.  Neurological: She is alert. She has normal strength. No cranial nerve deficit (no facial  droop, extraocular movements intact, no slurred speech) or sensory deficit. She exhibits normal muscle tone. She displays no seizure activity. Coordination normal.  Skin: Skin is warm and dry. No rash noted.  Psychiatric: She has a normal mood and affect.  Nursing note and vitals reviewed.    ED Treatments / Results  Labs (all labs ordered are listed, but only abnormal results are displayed) Labs Reviewed - No data to display  EKG  EKG Interpretation None       Radiology Dg Chest 2 View  Result Date: 09/29/2015 CLINICAL DATA:  Initial encounter. 45 y/o female with c/o congestive cough, fever, weakness, aches, chills, headaches. Recently came back from the [place]. No  hx heart or lung issues. Non-smoker EXAM: CHEST  2 VIEW COMPARISON:  None. FINDINGS: Midline trachea.  Normal heart size and mediastinal contours. Sharp costophrenic angles.  No pneumothorax.  Clear lungs. Lateral view degraded by patient arm position. IMPRESSION: No active cardiopulmonary disease. Electronically Signed   By: Abigail Miyamoto M.D.   On: 09/29/2015 10:18    Procedures Procedures (including critical care time)  Medications Ordered in ED Medications - No data to display   Initial Impression / Assessment and Plan / ED Course  I have reviewed the triage vital signs and the nursing notes.  Pertinent labs & imaging results that were available during my care of the patient were reviewed by me and considered in my medical decision making (see chart for details).  Clinical Course  Comment By Time  CXR normal Dorie Rank, MD 08/19 1038    No meningismus.  Doubt meningitis, encephalitis.  Pt was concerned about zika virus.  Discussed that treatment would be supportve and usually is self limited.  Not a high incidence in the [place] but it is present.  Doubt yellow fever, typhoid at this point.  Patient's symptoms are most likely related to a viral infection.  Dc home, supportive meds.  Follow up with PCP  Final Clinical Impressions(s) / ED Diagnoses   Final diagnoses:  Viral illness    New Prescriptions New Prescriptions   BENZONATATE (TESSALON) 100 MG CAPSULE    Take 1 capsule (100 mg total) by mouth every 8 (eight) hours.   IBUPROFEN (ADVIL,MOTRIN) 600 MG TABLET    Take 1 tablet (600 mg total) by mouth every 8 (eight) hours as needed.     Dorie Rank, MD 09/29/15 [phone]

## 2016-06-25 ENCOUNTER — Encounter: Payer: Self-pay | Admitting: Gynecology

## 2016-07-22 ENCOUNTER — Ambulatory Visit (INDEPENDENT_AMBULATORY_CARE_PROVIDER_SITE_OTHER): Payer: BLUE CROSS/BLUE SHIELD | Admitting: Women's Health

## 2016-07-22 ENCOUNTER — Encounter: Payer: Self-pay | Admitting: Women's Health

## 2016-07-22 VITALS — BP 126/78 | Ht 68.0 in | Wt 177.0 lb

## 2016-07-22 DIAGNOSIS — R6889 Other general symptoms and signs: Secondary | ICD-10-CM | POA: Diagnosis not present

## 2016-07-22 DIAGNOSIS — Z01419 Encounter for gynecological examination (general) (routine) without abnormal findings: Secondary | ICD-10-CM | POA: Diagnosis not present

## 2016-07-22 NOTE — Patient Instructions (Signed)
Health Maintenance for Postmenopausal Women Menopause is a normal process in which your reproductive ability comes to an end. This process happens gradually over a span of months to years, usually between the ages of 22 and 9. Menopause is complete when you have missed 12 consecutive menstrual periods. It is important to talk with your health care provider about some of the most common conditions that affect postmenopausal women, such as heart disease, cancer, and bone loss (osteoporosis). Adopting a healthy lifestyle and getting preventive care can help to promote your health and wellness. Those actions can also lower your chances of developing some of these common conditions. What should I know about menopause? During menopause, you may experience a number of symptoms, such as:  Moderate-to-severe hot flashes.  Night sweats.  Decrease in sex drive.  Mood swings.  Headaches.  Tiredness.  Irritability.  Memory problems.  Insomnia.  Choosing to treat or not to treat menopausal changes is an individual decision that you make with your health care provider. What should I know about hormone replacement therapy and supplements? Hormone therapy products are effective for treating symptoms that are associated with menopause, such as hot flashes and night sweats. Hormone replacement carries certain risks, especially as you become older. If you are thinking about using estrogen or estrogen with progestin treatments, discuss the benefits and risks with your health care provider. What should I know about heart disease and stroke? Heart disease, heart attack, and stroke become more likely as you age. This may be due, in part, to the hormonal changes that your body experiences during menopause. These can affect how your body processes dietary fats, triglycerides, and cholesterol. Heart attack and stroke are both medical emergencies. There are many things that you can do to help prevent heart disease  and stroke:  Have your blood pressure checked at least every 1-2 years. High blood pressure causes heart disease and increases the risk of stroke.  If you are 53-22 years old, ask your health care provider if you should take aspirin to prevent a heart attack or a stroke.  Do not use any tobacco products, including cigarettes, chewing tobacco, or electronic cigarettes. If you need help quitting, ask your health care provider.  It is important to eat a healthy diet and maintain a healthy weight. ? Be sure to include plenty of vegetables, fruits, low-fat dairy products, and lean protein. ? Avoid eating foods that are high in solid fats, added sugars, or salt (sodium).  Get regular exercise. This is one of the most important things that you can do for your health. ? Try to exercise for at least 150 minutes each week. The type of exercise that you do should increase your heart rate and make you sweat. This is known as moderate-intensity exercise. ? Try to do strengthening exercises at least twice each week. Do these in addition to the moderate-intensity exercise.  Know your numbers.Ask your health care provider to check your cholesterol and your blood glucose. Continue to have your blood tested as directed by your health care provider.  What should I know about cancer screening? There are several types of cancer. Take the following steps to reduce your risk and to catch any cancer development as early as possible. Breast Cancer  Practice breast self-awareness. ? This means understanding how your breasts normally appear and feel. ? It also means doing regular breast self-exams. Let your health care provider know about any changes, no matter how small.  If you are 40  or older, have a clinician do a breast exam (clinical breast exam or CBE) every year. Depending on your age, family history, and medical history, it may be recommended that you also have a yearly breast X-ray (mammogram).  If you  have a family history of breast cancer, talk with your health care provider about genetic screening.  If you are at high risk for breast cancer, talk with your health care provider about having an MRI and a mammogram every year.  Breast cancer (BRCA) gene test is recommended for women who have family members with BRCA-related cancers. Results of the assessment will determine the need for genetic counseling and BRCA1 and for BRCA2 testing. BRCA-related cancers include these types: ? Breast. This occurs in males or females. ? Ovarian. ? Tubal. This may also be called fallopian tube cancer. ? Cancer of the abdominal or pelvic lining (peritoneal cancer). ? Prostate. ? Pancreatic.  Cervical, Uterine, and Ovarian Cancer Your health care provider may recommend that you be screened regularly for cancer of the pelvic organs. These include your ovaries, uterus, and vagina. This screening involves a pelvic exam, which includes checking for microscopic changes to the surface of your cervix (Pap test).  For women ages 21-65, health care providers may recommend a pelvic exam and a Pap test every three years. For women ages 79-65, they may recommend the Pap test and pelvic exam, combined with testing for human papilloma virus (HPV), every five years. Some types of HPV increase your risk of cervical cancer. Testing for HPV may also be done on women of any age who have unclear Pap test results.  Other health care providers may not recommend any screening for nonpregnant women who are considered low risk for pelvic cancer and have no symptoms. Ask your health care provider if a screening pelvic exam is right for you.  If you have had past treatment for cervical cancer or a condition that could lead to cancer, you need Pap tests and screening for cancer for at least 20 years after your treatment. If Pap tests have been discontinued for you, your risk factors (such as having a new sexual partner) need to be  reassessed to determine if you should start having screenings again. Some women have medical problems that increase the chance of getting cervical cancer. In these cases, your health care provider may recommend that you have screening and Pap tests more often.  If you have a family history of uterine cancer or ovarian cancer, talk with your health care provider about genetic screening.  If you have vaginal bleeding after reaching menopause, tell your health care provider.  There are currently no reliable tests available to screen for ovarian cancer.  Lung Cancer Lung cancer screening is recommended for adults 69-62 years old who are at high risk for lung cancer because of a history of smoking. A yearly low-dose CT scan of the lungs is recommended if you:  Currently smoke.  Have a history of at least 30 pack-years of smoking and you currently smoke or have quit within the past 15 years. A pack-year is smoking an average of one pack of cigarettes per day for one year.  Yearly screening should:  Continue until it has been 15 years since you quit.  Stop if you develop a health problem that would prevent you from having lung cancer treatment.  Colorectal Cancer  This type of cancer can be detected and can often be prevented.  Routine colorectal cancer screening usually begins at  age 42 and continues through age 45.  If you have risk factors for colon cancer, your health care provider may recommend that you be screened at an earlier age.  If you have a family history of colorectal cancer, talk with your health care provider about genetic screening.  Your health care provider may also recommend using home test kits to check for hidden blood in your stool.  A small camera at the end of a tube can be used to examine your colon directly (sigmoidoscopy or colonoscopy). This is done to check for the earliest forms of colorectal cancer.  Direct examination of the colon should be repeated every  5-10 years until age 71. However, if early forms of precancerous polyps or small growths are found or if you have a family history or genetic risk for colorectal cancer, you may need to be screened more often.  Skin Cancer  Check your skin from head to toe regularly.  Monitor any moles. Be sure to tell your health care provider: ? About any new moles or changes in moles, especially if there is a change in a mole's shape or color. ? If you have a mole that is larger than the size of a pencil eraser.  If any of your family members has a history of skin cancer, especially at a young age, talk with your health care provider about genetic screening.  Always use sunscreen. Apply sunscreen liberally and repeatedly throughout the day.  Whenever you are outside, protect yourself by wearing long sleeves, pants, a wide-brimmed hat, and sunglasses.  What should I know about osteoporosis? Osteoporosis is a condition in which bone destruction happens more quickly than new bone creation. After menopause, you may be at an increased risk for osteoporosis. To help prevent osteoporosis or the bone fractures that can happen because of osteoporosis, the following is recommended:  If you are 46-71 years old, get at least 1,000 mg of calcium and at least 600 mg of vitamin D per day.  If you are older than age 55 but younger than age 65, get at least 1,200 mg of calcium and at least 600 mg of vitamin D per day.  If you are older than age 54, get at least 1,200 mg of calcium and at least 800 mg of vitamin D per day.  Smoking and excessive alcohol intake increase the risk of osteoporosis. Eat foods that are rich in calcium and vitamin D, and do weight-bearing exercises several times each week as directed by your health care provider. What should I know about how menopause affects my mental health? Depression may occur at any age, but it is more common as you become older. Common symptoms of depression  include:  Low or sad mood.  Changes in sleep patterns.  Changes in appetite or eating patterns.  Feeling an overall lack of motivation or enjoyment of activities that you previously enjoyed.  Frequent crying spells.  Talk with your health care provider if you think that you are experiencing depression. What should I know about immunizations? It is important that you get and maintain your immunizations. These include:  Tetanus, diphtheria, and pertussis (Tdap) booster vaccine.  Influenza every year before the flu season begins.  Pneumonia vaccine.  Shingles vaccine.  Your health care provider may also recommend other immunizations. This information is not intended to replace advice given to you by your health care provider. Make sure you discuss any questions you have with your health care provider. Document Released: 03/21/2005  Document Revised: 08/17/2015 Document Reviewed: 10/31/2014 Elsevier Interactive Patient Education  2018 [employer]  

## 2016-07-22 NOTE — Progress Notes (Signed)
ASAL TEAS May 29, 1970 [id number]    History:    Presents for annual exam.  Postmenopausal 1 year with no bleeding on no HRT. Having no menopausal symptoms, states is always cold. Husband vasectomy. Normal Pap and mammogram history. Mother questionable ovarian/uterine cancer.  Past medical history, past surgical history, family history and social history were all reviewed and documented in the EPIC chart. Working at a [employer] and enjoys job. Son graduated college. Daughter 62 doing well and has received Gardasil. Sister died of a stroke at age 43. Father diabetes.  ROS:  A ROS was performed and pertinent positives and negatives are included.  Exam:  Vitals:   07/22/16 0858  BP: 126/78  Weight: 177 lb (80.3 kg)  Height: 5\' 8"  (1.727 m)   Body mass index is 26.91 kg/m.   General appearance:  Normal Thyroid:  Symmetrical, normal in size, without palpable masses or nodularity. Respiratory  Auscultation:  Clear without wheezing or rhonchi Cardiovascular  Auscultation:  Regular rate, without rubs, murmurs or gallops  Edema/varicosities:  Not grossly evident Abdominal  Soft,nontender, without masses, guarding or rebound.  Liver/spleen:  No organomegaly noted  Hernia:  None appreciated  Skin  Inspection:  Grossly normal   Breasts: Examined lying and sitting.     Right: Without masses, retractions, discharge or axillary adenopathy.     Left: Without masses, retractions, discharge or axillary adenopathy. Gentitourinary   Inguinal/mons:  Normal without inguinal adenopathy  External genitalia:  Normal  BUS/Urethra/Skene's glands:  Normal  Vagina:  Normal  Cervix:  Normal  Uterus:  normal in size, shape and contour.  Midline and mobile  Adnexa/parametria:     Rt: Without masses or tenderness.   Lt: Without masses or tenderness.  Anus and perineum: Normal  Digital rectal exam: Normal sphincter tone without palpated masses or tenderness  Assessment/Plan:  46 y.o. MBF G2 P2 for  annual exam with no complaints.  Postmenopausal on no HRT with no bleeding  Always " cold" Primary care manages labs  Plan: TSH, reviewed unusual to have a constant feeling of coldness especially with menopause. SBE's, continue annual screening mammogram, calcium rich diet, vitamin D 2000 daily encouraged. Reviewed importance of weightbearing exercise, decreasing carbs in diet, has gained several pounds. Paps normal 2017, new screening guidelines reviewed.   [place], 1:34 PM 07/22/2016

## 2017-01-13 DIAGNOSIS — J029 Acute pharyngitis, unspecified: Secondary | ICD-10-CM | POA: Diagnosis not present

## 2017-01-15 DIAGNOSIS — F4323 Adjustment disorder with mixed anxiety and depressed mood: Secondary | ICD-10-CM | POA: Diagnosis not present

## 2017-02-02 DIAGNOSIS — Z1231 Encounter for screening mammogram for malignant neoplasm of breast: Secondary | ICD-10-CM | POA: Diagnosis not present

## 2017-02-02 DIAGNOSIS — Z0001 Encounter for general adult medical examination with abnormal findings: Secondary | ICD-10-CM | POA: Diagnosis not present

## 2017-02-02 DIAGNOSIS — M25561 Pain in right knee: Secondary | ICD-10-CM | POA: Diagnosis not present

## 2017-02-02 DIAGNOSIS — Z136 Encounter for screening for cardiovascular disorders: Secondary | ICD-10-CM | POA: Diagnosis not present

## 2017-02-02 DIAGNOSIS — Z13220 Encounter for screening for lipoid disorders: Secondary | ICD-10-CM | POA: Diagnosis not present

## 2017-09-02 ENCOUNTER — Other Ambulatory Visit: Payer: Self-pay | Admitting: Women's Health

## 2017-09-02 DIAGNOSIS — Z1231 Encounter for screening mammogram for malignant neoplasm of breast: Secondary | ICD-10-CM

## 2017-09-25 ENCOUNTER — Ambulatory Visit
Admission: RE | Admit: 2017-09-25 | Discharge: 2017-09-25 | Disposition: A | Discharge status: Home / Self Care | Payer: Commercial Managed Care - PPO | Source: Ambulatory Visit | Attending: Women's Health | Admitting: Women's Health

## 2017-09-25 DIAGNOSIS — Z1231 Encounter for screening mammogram for malignant neoplasm of breast: Secondary | ICD-10-CM

## 2017-11-03 ENCOUNTER — Encounter: Payer: Self-pay | Admitting: Women's Health

## 2017-11-03 ENCOUNTER — Ambulatory Visit (INDEPENDENT_AMBULATORY_CARE_PROVIDER_SITE_OTHER): Payer: Commercial Managed Care - PPO | Admitting: Women's Health

## 2017-11-03 VITALS — BP 126/80 | Ht 68.0 in | Wt 178.0 lb

## 2017-11-03 DIAGNOSIS — Z01419 Encounter for gynecological examination (general) (routine) without abnormal findings: Secondary | ICD-10-CM

## 2017-11-03 DIAGNOSIS — Z23 Encounter for immunization: Secondary | ICD-10-CM | POA: Diagnosis not present

## 2017-11-03 NOTE — Patient Instructions (Signed)
[facility]  Dr Carlean Purl  [place] Maintenance for Postmenopausal Women Menopause is a normal process in which your reproductive ability comes to an end. This process happens gradually over a span of months to years, usually between the ages of 17 and 39. Menopause is complete when you have missed 12 consecutive menstrual periods. It is important to talk with your health care provider about some of the most common conditions that affect postmenopausal women, such as heart disease, cancer, and bone loss (osteoporosis). Adopting a healthy lifestyle and getting preventive care can help to promote your health and wellness. Those actions can also lower your chances of developing some of these common conditions. What should I know about menopause? During menopause, you may experience a number of symptoms, such as:  Moderate-to-severe hot flashes.  Night sweats.  Decrease in sex drive.  Mood swings.  Headaches.  Tiredness.  Irritability.  Memory problems.  Insomnia.  Choosing to treat or not to treat menopausal changes is an individual decision that you make with your health care provider. What should I know about hormone replacement therapy and supplements? Hormone therapy products are effective for treating symptoms that are associated with menopause, such as hot flashes and night sweats. Hormone replacement carries certain risks, especially as you become older. If you are thinking about using estrogen or estrogen with progestin treatments, discuss the benefits and risks with your health care provider. What should I know about heart disease and stroke? Heart disease, heart attack, and stroke become more likely as you age. This may be due, in part, to the hormonal changes that your body experiences during menopause. These can affect how your body processes dietary fats, triglycerides, and cholesterol. Heart attack and stroke are both medical emergencies. There are many things that  you can do to help prevent heart disease and stroke:  Have your blood pressure checked at least every 1-2 years. High blood pressure causes heart disease and increases the risk of stroke.  If you are 49-68 years old, ask your health care provider if you should take aspirin to prevent a heart attack or a stroke.  Do not use any tobacco products, including cigarettes, chewing tobacco, or electronic cigarettes. If you need help quitting, ask your health care provider.  It is important to eat a healthy diet and maintain a healthy weight. ? Be sure to include plenty of vegetables, fruits, low-fat dairy products, and lean protein. ? Avoid eating foods that are high in solid fats, added sugars, or salt (sodium).  Get regular exercise. This is one of the most important things that you can do for your health. ? Try to exercise for at least 150 minutes each week. The type of exercise that you do should increase your heart rate and make you sweat. This is known as moderate-intensity exercise. ? Try to do strengthening exercises at least twice each week. Do these in addition to the moderate-intensity exercise.  Know your numbers.Ask your health care provider to check your cholesterol and your blood glucose. Continue to have your blood tested as directed by your health care provider.  What should I know about cancer screening? There are several types of cancer. Take the following steps to reduce your risk and to catch any cancer development as early as possible. Breast Cancer  Practice breast self-awareness. ? This means understanding how your breasts normally appear and feel. ? It also means doing regular breast self-exams. Let your health care provider know about any changes,  no matter how small.  If you are 49 or older, have a clinician do a breast exam (clinical breast exam or CBE) every year. Depending on your age, family history, and medical history, it may be recommended that you also have a  yearly breast X-ray (mammogram).  If you have a family history of breast cancer, talk with your health care provider about genetic screening.  If you are at high risk for breast cancer, talk with your health care provider about having an MRI and a mammogram every year.  Breast cancer (BRCA) gene test is recommended for women who have family members with BRCA-related cancers. Results of the assessment will determine the need for genetic counseling and BRCA1 and for BRCA2 testing. BRCA-related cancers include these types: ? Breast. This occurs in males or females. ? Ovarian. ? Tubal. This may also be called fallopian tube cancer. ? Cancer of the abdominal or pelvic lining (peritoneal cancer). ? Prostate. ? Pancreatic.  Cervical, Uterine, and Ovarian Cancer Your health care provider may recommend that you be screened regularly for cancer of the pelvic organs. These include your ovaries, uterus, and vagina. This screening involves a pelvic exam, which includes checking for microscopic changes to the surface of your cervix (Pap test).  For women ages 21-65, health care providers may recommend a pelvic exam and a Pap test every three years. For women ages 49-65, they may recommend the Pap test and pelvic exam, combined with testing for human papilloma virus (HPV), every five years. Some types of HPV increase your risk of cervical cancer. Testing for HPV may also be done on women of any age who have unclear Pap test results.  Other health care providers may not recommend any screening for nonpregnant women who are considered low risk for pelvic cancer and have no symptoms. Ask your health care provider if a screening pelvic exam is right for you.  If you have had past treatment for cervical cancer or a condition that could lead to cancer, you need Pap tests and screening for cancer for at least 20 years after your treatment. If Pap tests have been discontinued for you, your risk factors (such as having  a new sexual partner) need to be reassessed to determine if you should start having screenings again. Some women have medical problems that increase the chance of getting cervical cancer. In these cases, your health care provider may recommend that you have screening and Pap tests more often.  If you have a family history of uterine cancer or ovarian cancer, talk with your health care provider about genetic screening.  If you have vaginal bleeding after reaching menopause, tell your health care provider.  There are currently no reliable tests available to screen for ovarian cancer.  Lung Cancer Lung cancer screening is recommended for adults 28-64 years old who are at high risk for lung cancer because of a history of smoking. A yearly low-dose CT scan of the lungs is recommended if you:  Currently smoke.  Have a history of at least 30 pack-years of smoking and you currently smoke or have quit within the past 15 years. A pack-year is smoking an average of one pack of cigarettes per day for one year.  Yearly screening should:  Continue until it has been 15 years since you quit.  Stop if you develop a health problem that would prevent you from having lung cancer treatment.  Colorectal Cancer  This type of cancer can be detected and can often be  prevented.  Routine colorectal cancer screening usually begins at age 59 and continues through age 53.  If you have risk factors for colon cancer, your health care provider may recommend that you be screened at an earlier age.  If you have a family history of colorectal cancer, talk with your health care provider about genetic screening.  Your health care provider may also recommend using home test kits to check for hidden blood in your stool.  A small camera at the end of a tube can be used to examine your colon directly (sigmoidoscopy or colonoscopy). This is done to check for the earliest forms of colorectal cancer.  Direct examination of  the colon should be repeated every 5-10 years until age 62. However, if early forms of precancerous polyps or small growths are found or if you have a family history or genetic risk for colorectal cancer, you may need to be screened more often.  Skin Cancer  Check your skin from head to toe regularly.  Monitor any moles. Be sure to tell your health care provider: ? About any new moles or changes in moles, especially if there is a change in a mole's shape or color. ? If you have a mole that is larger than the size of a pencil eraser.  If any of your family members has a history of skin cancer, especially at a Irja Wheless age, talk with your health care provider about genetic screening.  Always use sunscreen. Apply sunscreen liberally and repeatedly throughout the day.  Whenever you are outside, protect yourself by wearing long sleeves, pants, a wide-brimmed hat, and sunglasses.  What should I know about osteoporosis? Osteoporosis is a condition in which bone destruction happens more quickly than new bone creation. After menopause, you may be at an increased risk for osteoporosis. To help prevent osteoporosis or the bone fractures that can happen because of osteoporosis, the following is recommended:  If you are 60-44 years old, get at least 1,000 mg of calcium and at least 600 mg of vitamin D per day.  If you are older than age 24 but younger than age 38, get at least 1,200 mg of calcium and at least 600 mg of vitamin D per day.  If you are older than age 41, get at least 1,200 mg of calcium and at least 800 mg of vitamin D per day.  Smoking and excessive alcohol intake increase the risk of osteoporosis. Eat foods that are rich in calcium and vitamin D, and do weight-bearing exercises several times each week as directed by your health care provider. What should I know about how menopause affects my mental health? Depression may occur at any age, but it is more common as you become older. Common  symptoms of depression include:  Low or sad mood.  Changes in sleep patterns.  Changes in appetite or eating patterns.  Feeling an overall lack of motivation or enjoyment of activities that you previously enjoyed.  Frequent crying spells.  Talk with your health care provider if you think that you are experiencing depression. What should I know about immunizations? It is important that you get and maintain your immunizations. These include:  Tetanus, diphtheria, and pertussis (Tdap) booster vaccine.  Influenza every year before the flu season begins.  Pneumonia vaccine.  Shingles vaccine.  Your health care provider may also recommend other immunizations. This information is not intended to replace advice given to you by your health care provider. Make sure you discuss any questions you  have with your health care provider. Document Released: 03/21/2005 Document Revised: 08/17/2015 Document Reviewed: 10/31/2014 Elsevier Interactive Patient Education  2018 [employer].
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
## 2017-11-04 LAB — URINALYSIS, COMPLETE W/RFL CULTURE
BACTERIA UA: NONE SEEN /HPF
Bilirubin Urine: NEGATIVE
Glucose, UA: NEGATIVE
Hgb urine dipstick: NEGATIVE
Hyaline Cast: NONE SEEN /LPF
Ketones, ur: NEGATIVE
LEUKOCYTE ESTERASE: NEGATIVE
Nitrites, Initial: NEGATIVE
PH: 7 (ref 5.0–8.0)
Protein, ur: NEGATIVE
RBC / HPF: NONE SEEN /HPF (ref 0–2)
Specific Gravity, Urine: 1.014 (ref 1.001–1.03)
WBC, UA: NONE SEEN /HPF (ref 0–5)

## 2017-11-04 LAB — NO CULTURE INDICATED

## 2018-12-01 ENCOUNTER — Ambulatory Visit (INDEPENDENT_AMBULATORY_CARE_PROVIDER_SITE_OTHER): Payer: BC Managed Care – PPO | Admitting: Women's Health

## 2018-12-01 ENCOUNTER — Encounter: Payer: Self-pay | Admitting: Women's Health

## 2018-12-01 ENCOUNTER — Other Ambulatory Visit: Payer: Self-pay

## 2018-12-01 VITALS — BP 118/80 | Ht 68.0 in | Wt 181.0 lb

## 2018-12-01 DIAGNOSIS — Z13220 Encounter for screening for lipoid disorders: Secondary | ICD-10-CM | POA: Diagnosis not present

## 2018-12-01 DIAGNOSIS — Z01419 Encounter for gynecological examination (general) (routine) without abnormal findings: Secondary | ICD-10-CM | POA: Diagnosis not present

## 2018-12-01 LAB — COMPREHENSIVE METABOLIC PANEL
AG Ratio: 1.6 (calc) (ref 1.0–2.5)
ALT: 15 U/L (ref 6–29)
AST: 19 U/L (ref 10–35)
Albumin: 4.5 g/dL (ref 3.6–5.1)
Alkaline phosphatase (APISO): 87 U/L (ref 37–153)
BUN: 12 mg/dL (ref 7–25)
CO2: 30 mmol/L (ref 20–32)
Calcium: 10.4 mg/dL (ref 8.6–10.4)
Chloride: 104 mmol/L (ref 98–110)
Creat: 0.84 mg/dL (ref 0.50–1.05)
Globulin: 2.9 g/dL (calc) (ref 1.9–3.7)
Glucose, Bld: 102 mg/dL — ABNORMAL HIGH (ref 65–99)
Potassium: 4.5 mmol/L (ref 3.5–5.3)
Sodium: 141 mmol/L (ref 135–146)
Total Bilirubin: 0.4 mg/dL (ref 0.2–1.2)
Total Protein: 7.4 g/dL (ref 6.1–8.1)

## 2018-12-01 LAB — CBC WITH DIFFERENTIAL/PLATELET
Absolute Monocytes: 230 cells/uL (ref 200–950)
Basophils Absolute: 29 cells/uL (ref 0–200)
Basophils Relative: 0.6 %
Eosinophils Absolute: 78 cells/uL (ref 15–500)
Eosinophils Relative: 1.6 %
HCT: 44.2 % (ref 35.0–45.0)
Hemoglobin: 14.5 g/dL (ref 11.7–15.5)
Lymphs Abs: 2058 cells/uL (ref 850–3900)
MCH: 29.7 pg (ref 27.0–33.0)
MCHC: 32.8 g/dL (ref 32.0–36.0)
MCV: 90.6 fL (ref 80.0–100.0)
MPV: 13.8 fL — ABNORMAL HIGH (ref 7.5–12.5)
Monocytes Relative: 4.7 %
Neutro Abs: 2504 cells/uL (ref 1500–7800)
Neutrophils Relative %: 51.1 %
Platelets: 208 10*3/uL (ref 140–400)
RBC: 4.88 10*6/uL (ref 3.80–5.10)
RDW: 14 % (ref 11.0–15.0)
Total Lymphocyte: 42 %
WBC: 4.9 10*3/uL (ref 3.8–10.8)

## 2018-12-01 LAB — LIPID PANEL
Cholesterol: 177 mg/dL (ref <200)
HDL: 67 mg/dL (ref >=50)
LDL Cholesterol (Calc): 90 mg/dL (calc)
Non-HDL Cholesterol (Calc): 110 mg/dL (calc) (ref <130)
Total CHOL/HDL Ratio: 2.6 (calc) (ref <5.0)
Triglycerides: 100 mg/dL (ref <150)

## 2018-12-01 NOTE — Progress Notes (Signed)
Kathryn Farley 1971-02-07 [id number]    History:    Presents for annual exam.  Postmenopausal  3 years with no bleeding on no HRT minimal menopausal symptoms.  Normal Pap and mammogram history.  Asymptomatic fibroids.  Past medical history, past surgical history, family history and social history were all reviewed and documented in the EPIC chart.  Desk job.  Father diabetes, mother probable uterine cancer in her 50s.  Sister deceased from stroke at age 50, other sister severe depression, hoping she moves local.  Son graduated from college, daughter senior in high school, both doing well.  Originally from [place].  History of depression has had counseling in the past.  ROS:  A ROS was performed and pertinent positives and negatives are included.  Exam:  Vitals:   12/01/18 0837  BP: 118/80  Weight: 181 lb (82.1 kg)  Height: 5\' 8"  (1.727 m)   Body mass index is 27.52 kg/m.   General appearance:  Normal Thyroid:  Symmetrical, normal in size, without palpable masses or nodularity. Respiratory  Auscultation:  Clear without wheezing or rhonchi Cardiovascular  Auscultation:  Regular rate, without rubs, murmurs or gallops  Edema/varicosities:  Not grossly evident Abdominal  Soft,nontender, without masses, guarding or rebound.  Liver/spleen:  No organomegaly noted  Hernia:  None appreciated  Skin  Inspection:  Grossly normal   Breasts: Examined lying and sitting.     Right: Without masses, retractions, discharge or axillary adenopathy.     Left: Without masses, retractions, discharge or axillary adenopathy. Gentitourinary   Inguinal/mons:  Normal without inguinal adenopathy  External genitalia:  Normal  BUS/Urethra/Skene's glands:  Normal  Vagina:  Normal  Cervix:  Normal  Uterus:   normal in size, shape and contour.  Midline and mobile  Adnexa/parametria:     Rt: Without masses or tenderness.   Lt: Without masses or tenderness.  Anus and perineum: Normal  Digital rectal  exam: Normal sphincter tone without palpated masses or tenderness  Assessment/Plan:  48 y.o. MBF G2, P2 for annual exam with no complaints.  Postmenopausal/no HRT/no bleeding Asymptomatic fibroids  Plan: SBEs, continue annual screening mammogram, due instructed to schedule.  Regular exercise, calcium rich foods, vitamin D 2000 daily encouraged.  Screening colonoscopy discussed, [facility] information given instructed to schedule.  CBC, CMP, lipid panel, Pap normal 2017 with negative high-risk HPV new screening guidelines reviewed.   [place], 9:59 AM 12/01/2018

## 2018-12-01 NOTE — Patient Instructions (Addendum)
Colonoscopy  [id number]  Dr Carlean Purl Mammogram  [phone]  Health Maintenance for Postmenopausal Women Menopause is a normal process in which your ability to get pregnant comes to an end. This process happens slowly over many months or years, usually between the ages of 42 and 67. Menopause is complete when you have missed your menstrual periods for 12 months. It is important to talk with your health care provider about some of the most common conditions that affect women after menopause (postmenopausal women). These include heart disease, cancer, and bone loss (osteoporosis). Adopting a healthy lifestyle and getting preventive care can help to promote your health and wellness. The actions you take can also lower your chances of developing some of these common conditions. What should I know about menopause? During menopause, you may get a number of symptoms, such as:  Hot flashes. These can be moderate or severe.  Night sweats.  Decrease in sex drive.  Mood swings.  Headaches.  Tiredness.  Irritability.  Memory problems.  Insomnia. Choosing to treat or not to treat these symptoms is a decision that you make with your health care provider. Do I need hormone replacement therapy?  Hormone replacement therapy is effective in treating symptoms that are caused by menopause, such as hot flashes and night sweats.  Hormone replacement carries certain risks, especially as you become older. If you are thinking about using estrogen or estrogen with progestin, discuss the benefits and risks with your health care provider. What is my risk for heart disease and stroke? The risk of heart disease, heart attack, and stroke increases as you age. One of the causes may be a change in the body's hormones during menopause. This can affect how your body uses dietary fats, triglycerides, and cholesterol. Heart attack and stroke are medical emergencies. There are many things that you can do to help prevent heart  disease and stroke. Watch your blood pressure  High blood pressure causes heart disease and increases the risk of stroke. This is more likely to develop in people who have high blood pressure readings, are of African descent, or are overweight.  Have your blood pressure checked: ? Every 3-5 years if you are 21-14 years of age. ? Every year if you are 84 years old or older. Eat a healthy diet   Eat a diet that includes plenty of vegetables, fruits, low-fat dairy products, and lean protein.  Do not eat a lot of foods that are high in solid fats, added sugars, or sodium. Get regular exercise Get regular exercise. This is one of the most important things you can do for your health. Most adults should:  Try to exercise for at least 150 minutes each week. The exercise should increase your heart rate and make you sweat (moderate-intensity exercise).  Try to do strengthening exercises at least twice each week. Do these in addition to the moderate-intensity exercise.  Spend less time sitting. Even light physical activity can be beneficial. Other tips  Work with your health care provider to achieve or maintain a healthy weight.  Do not use any products that contain nicotine or tobacco, such as cigarettes, e-cigarettes, and chewing tobacco. If you need help quitting, ask your health care provider.  Know your numbers. Ask your health care provider to check your cholesterol and your blood sugar (glucose). Continue to have your blood tested as directed by your health care provider. Do I need screening for cancer? Depending on your health history and family history, you may need to  have cancer screening at different stages of your life. This may include screening for:  Breast cancer.  Cervical cancer.  Lung cancer.  Colorectal cancer. What is my risk for osteoporosis? After menopause, you may be at increased risk for osteoporosis. Osteoporosis is a condition in which bone destruction happens  more quickly than new bone creation. To help prevent osteoporosis or the bone fractures that can happen because of osteoporosis, you may take the following actions:  If you are 50-64 years old, get at least 1,000 mg of calcium and at least 600 mg of vitamin D per day.  If you are older than age 7 but younger than age 46, get at least 1,200 mg of calcium and at least 600 mg of vitamin D per day.  If you are older than age 42, get at least 1,200 mg of calcium and at least 800 mg of vitamin D per day. Smoking and drinking excessive alcohol increase the risk of osteoporosis. Eat foods that are rich in calcium and vitamin D, and do weight-bearing exercises several times each week as directed by your health care provider. How does menopause affect my mental health? Depression may occur at any age, but it is more common as you become older. Common symptoms of depression include:  Low or sad mood.  Changes in sleep patterns.  Changes in appetite or eating patterns.  Feeling an overall lack of motivation or enjoyment of activities that you previously enjoyed.  Frequent crying spells. Talk with your health care provider if you think that you are experiencing depression. General instructions See your health care provider for regular wellness exams and vaccines. This may include:  Scheduling regular health, dental, and eye exams.  Getting and maintaining your vaccines. These include: ? Influenza vaccine. Get this vaccine each year before the flu season begins. ? Pneumonia vaccine. ? Shingles vaccine. ? Tetanus, diphtheria, and pertussis (Tdap) booster vaccine. Your health care provider may also recommend other immunizations. Tell your health care provider if you have ever been abused or do not feel safe at home. Summary  Menopause is a normal process in which your ability to get pregnant comes to an end.  This condition causes hot flashes, night sweats, decreased interest in sex, mood  swings, headaches, or lack of sleep.  Treatment for this condition may include hormone replacement therapy.  Take actions to keep yourself healthy, including exercising regularly, eating a healthy diet, watching your weight, and checking your blood pressure and blood sugar levels.  Get screened for cancer and depression. Make sure that you are up to date with all your vaccines. This information is not intended to replace advice given to you by your health care provider. Make sure you discuss any questions you have with your health care provider. Document Released: 03/21/2005 Document Revised: 01/20/2018 Document Reviewed: 01/20/2018 Elsevier Patient Education  2020 [employer].

## 2018-12-03 ENCOUNTER — Other Ambulatory Visit: Payer: Self-pay | Admitting: Women's Health

## 2018-12-03 DIAGNOSIS — Z1231 Encounter for screening mammogram for malignant neoplasm of breast: Secondary | ICD-10-CM

## 2018-12-09 DIAGNOSIS — M5386 Other specified dorsopathies, lumbar region: Secondary | ICD-10-CM | POA: Diagnosis not present

## 2018-12-09 DIAGNOSIS — M531 Cervicobrachial syndrome: Secondary | ICD-10-CM | POA: Diagnosis not present

## 2018-12-09 DIAGNOSIS — M9904 Segmental and somatic dysfunction of sacral region: Secondary | ICD-10-CM | POA: Diagnosis not present

## 2018-12-09 DIAGNOSIS — M9903 Segmental and somatic dysfunction of lumbar region: Secondary | ICD-10-CM | POA: Diagnosis not present

## 2018-12-13 DIAGNOSIS — M9903 Segmental and somatic dysfunction of lumbar region: Secondary | ICD-10-CM | POA: Diagnosis not present

## 2018-12-13 DIAGNOSIS — M9904 Segmental and somatic dysfunction of sacral region: Secondary | ICD-10-CM | POA: Diagnosis not present

## 2018-12-13 DIAGNOSIS — M531 Cervicobrachial syndrome: Secondary | ICD-10-CM | POA: Diagnosis not present

## 2018-12-13 DIAGNOSIS — M5386 Other specified dorsopathies, lumbar region: Secondary | ICD-10-CM | POA: Diagnosis not present

## 2018-12-15 DIAGNOSIS — M9904 Segmental and somatic dysfunction of sacral region: Secondary | ICD-10-CM | POA: Diagnosis not present

## 2018-12-15 DIAGNOSIS — M531 Cervicobrachial syndrome: Secondary | ICD-10-CM | POA: Diagnosis not present

## 2018-12-15 DIAGNOSIS — M9903 Segmental and somatic dysfunction of lumbar region: Secondary | ICD-10-CM | POA: Diagnosis not present

## 2018-12-15 DIAGNOSIS — M5386 Other specified dorsopathies, lumbar region: Secondary | ICD-10-CM | POA: Diagnosis not present

## 2018-12-16 NOTE — Telephone Encounter (Signed)
Called patient and per DPR access note left detailed message in voice mail and read her [place]'s my chart message about her labs.

## 2018-12-17 DIAGNOSIS — M9904 Segmental and somatic dysfunction of sacral region: Secondary | ICD-10-CM | POA: Diagnosis not present

## 2018-12-17 DIAGNOSIS — M9903 Segmental and somatic dysfunction of lumbar region: Secondary | ICD-10-CM | POA: Diagnosis not present

## 2018-12-17 DIAGNOSIS — M531 Cervicobrachial syndrome: Secondary | ICD-10-CM | POA: Diagnosis not present

## 2018-12-17 DIAGNOSIS — M5386 Other specified dorsopathies, lumbar region: Secondary | ICD-10-CM | POA: Diagnosis not present

## 2018-12-20 DIAGNOSIS — M9904 Segmental and somatic dysfunction of sacral region: Secondary | ICD-10-CM | POA: Diagnosis not present

## 2018-12-20 DIAGNOSIS — M5386 Other specified dorsopathies, lumbar region: Secondary | ICD-10-CM | POA: Diagnosis not present

## 2018-12-20 DIAGNOSIS — M9903 Segmental and somatic dysfunction of lumbar region: Secondary | ICD-10-CM | POA: Diagnosis not present

## 2018-12-20 DIAGNOSIS — M531 Cervicobrachial syndrome: Secondary | ICD-10-CM | POA: Diagnosis not present

## 2018-12-22 DIAGNOSIS — M531 Cervicobrachial syndrome: Secondary | ICD-10-CM | POA: Diagnosis not present

## 2018-12-22 DIAGNOSIS — M5386 Other specified dorsopathies, lumbar region: Secondary | ICD-10-CM | POA: Diagnosis not present

## 2018-12-22 DIAGNOSIS — M9903 Segmental and somatic dysfunction of lumbar region: Secondary | ICD-10-CM | POA: Diagnosis not present

## 2018-12-22 DIAGNOSIS — M9904 Segmental and somatic dysfunction of sacral region: Secondary | ICD-10-CM | POA: Diagnosis not present

## 2018-12-24 DIAGNOSIS — M5386 Other specified dorsopathies, lumbar region: Secondary | ICD-10-CM | POA: Diagnosis not present

## 2018-12-24 DIAGNOSIS — M9904 Segmental and somatic dysfunction of sacral region: Secondary | ICD-10-CM | POA: Diagnosis not present

## 2018-12-24 DIAGNOSIS — M531 Cervicobrachial syndrome: Secondary | ICD-10-CM | POA: Diagnosis not present

## 2018-12-24 DIAGNOSIS — M9903 Segmental and somatic dysfunction of lumbar region: Secondary | ICD-10-CM | POA: Diagnosis not present

## 2018-12-27 DIAGNOSIS — M5386 Other specified dorsopathies, lumbar region: Secondary | ICD-10-CM | POA: Diagnosis not present

## 2018-12-27 DIAGNOSIS — M531 Cervicobrachial syndrome: Secondary | ICD-10-CM | POA: Diagnosis not present

## 2018-12-27 DIAGNOSIS — M9903 Segmental and somatic dysfunction of lumbar region: Secondary | ICD-10-CM | POA: Diagnosis not present

## 2018-12-27 DIAGNOSIS — M9904 Segmental and somatic dysfunction of sacral region: Secondary | ICD-10-CM | POA: Diagnosis not present

## 2018-12-29 DIAGNOSIS — M531 Cervicobrachial syndrome: Secondary | ICD-10-CM | POA: Diagnosis not present

## 2018-12-29 DIAGNOSIS — M9903 Segmental and somatic dysfunction of lumbar region: Secondary | ICD-10-CM | POA: Diagnosis not present

## 2018-12-29 DIAGNOSIS — M9904 Segmental and somatic dysfunction of sacral region: Secondary | ICD-10-CM | POA: Diagnosis not present

## 2018-12-29 DIAGNOSIS — M5386 Other specified dorsopathies, lumbar region: Secondary | ICD-10-CM | POA: Diagnosis not present

## 2019-01-03 DIAGNOSIS — M9904 Segmental and somatic dysfunction of sacral region: Secondary | ICD-10-CM | POA: Diagnosis not present

## 2019-01-03 DIAGNOSIS — M531 Cervicobrachial syndrome: Secondary | ICD-10-CM | POA: Diagnosis not present

## 2019-01-03 DIAGNOSIS — M9903 Segmental and somatic dysfunction of lumbar region: Secondary | ICD-10-CM | POA: Diagnosis not present

## 2019-01-03 DIAGNOSIS — M5386 Other specified dorsopathies, lumbar region: Secondary | ICD-10-CM | POA: Diagnosis not present

## 2019-01-05 DIAGNOSIS — M5386 Other specified dorsopathies, lumbar region: Secondary | ICD-10-CM | POA: Diagnosis not present

## 2019-01-05 DIAGNOSIS — M9904 Segmental and somatic dysfunction of sacral region: Secondary | ICD-10-CM | POA: Diagnosis not present

## 2019-01-05 DIAGNOSIS — M531 Cervicobrachial syndrome: Secondary | ICD-10-CM | POA: Diagnosis not present

## 2019-01-05 DIAGNOSIS — M9903 Segmental and somatic dysfunction of lumbar region: Secondary | ICD-10-CM | POA: Diagnosis not present

## 2019-01-10 DIAGNOSIS — M5386 Other specified dorsopathies, lumbar region: Secondary | ICD-10-CM | POA: Diagnosis not present

## 2019-01-10 DIAGNOSIS — M9904 Segmental and somatic dysfunction of sacral region: Secondary | ICD-10-CM | POA: Diagnosis not present

## 2019-01-10 DIAGNOSIS — M531 Cervicobrachial syndrome: Secondary | ICD-10-CM | POA: Diagnosis not present

## 2019-01-10 DIAGNOSIS — M9903 Segmental and somatic dysfunction of lumbar region: Secondary | ICD-10-CM | POA: Diagnosis not present

## 2019-01-12 DIAGNOSIS — M531 Cervicobrachial syndrome: Secondary | ICD-10-CM | POA: Diagnosis not present

## 2019-01-12 DIAGNOSIS — M9904 Segmental and somatic dysfunction of sacral region: Secondary | ICD-10-CM | POA: Diagnosis not present

## 2019-01-12 DIAGNOSIS — M9903 Segmental and somatic dysfunction of lumbar region: Secondary | ICD-10-CM | POA: Diagnosis not present

## 2019-01-12 DIAGNOSIS — M5386 Other specified dorsopathies, lumbar region: Secondary | ICD-10-CM | POA: Diagnosis not present

## 2019-01-19 DIAGNOSIS — M531 Cervicobrachial syndrome: Secondary | ICD-10-CM | POA: Diagnosis not present

## 2019-01-19 DIAGNOSIS — M9903 Segmental and somatic dysfunction of lumbar region: Secondary | ICD-10-CM | POA: Diagnosis not present

## 2019-01-19 DIAGNOSIS — M9904 Segmental and somatic dysfunction of sacral region: Secondary | ICD-10-CM | POA: Diagnosis not present

## 2019-01-19 DIAGNOSIS — M5386 Other specified dorsopathies, lumbar region: Secondary | ICD-10-CM | POA: Diagnosis not present

## 2019-01-20 ENCOUNTER — Ambulatory Visit
Admission: RE | Admit: 2019-01-20 | Discharge: 2019-01-20 | Disposition: A | Discharge status: Home / Self Care | Payer: BC Managed Care – PPO | Source: Ambulatory Visit | Attending: Women's Health | Admitting: Women's Health

## 2019-01-20 ENCOUNTER — Other Ambulatory Visit: Payer: Self-pay

## 2019-01-20 DIAGNOSIS — Z1231 Encounter for screening mammogram for malignant neoplasm of breast: Secondary | ICD-10-CM

## 2019-01-26 DIAGNOSIS — M5386 Other specified dorsopathies, lumbar region: Secondary | ICD-10-CM | POA: Diagnosis not present

## 2019-01-26 DIAGNOSIS — M9904 Segmental and somatic dysfunction of sacral region: Secondary | ICD-10-CM | POA: Diagnosis not present

## 2019-01-26 DIAGNOSIS — M9903 Segmental and somatic dysfunction of lumbar region: Secondary | ICD-10-CM | POA: Diagnosis not present

## 2019-01-26 DIAGNOSIS — M531 Cervicobrachial syndrome: Secondary | ICD-10-CM | POA: Diagnosis not present

## 2019-02-02 DIAGNOSIS — M9904 Segmental and somatic dysfunction of sacral region: Secondary | ICD-10-CM | POA: Diagnosis not present

## 2019-02-02 DIAGNOSIS — M531 Cervicobrachial syndrome: Secondary | ICD-10-CM | POA: Diagnosis not present

## 2019-02-02 DIAGNOSIS — M9903 Segmental and somatic dysfunction of lumbar region: Secondary | ICD-10-CM | POA: Diagnosis not present

## 2019-02-02 DIAGNOSIS — M5386 Other specified dorsopathies, lumbar region: Secondary | ICD-10-CM | POA: Diagnosis not present

## 2019-03-01 ENCOUNTER — Ambulatory Visit: Payer: BC Managed Care – PPO | Attending: Internal Medicine

## 2019-03-01 DIAGNOSIS — Z20822 Contact with and (suspected) exposure to covid-19: Secondary | ICD-10-CM | POA: Diagnosis not present

## 2019-03-03 LAB — NOVEL CORONAVIRUS, NAA: SARS-CoV-2, NAA: NOT DETECTED

## 2019-03-16 DIAGNOSIS — M531 Cervicobrachial syndrome: Secondary | ICD-10-CM | POA: Diagnosis not present

## 2019-03-16 DIAGNOSIS — M9903 Segmental and somatic dysfunction of lumbar region: Secondary | ICD-10-CM | POA: Diagnosis not present

## 2019-03-16 DIAGNOSIS — M5386 Other specified dorsopathies, lumbar region: Secondary | ICD-10-CM | POA: Diagnosis not present

## 2019-03-16 DIAGNOSIS — M9904 Segmental and somatic dysfunction of sacral region: Secondary | ICD-10-CM | POA: Diagnosis not present

## 2019-03-29 ENCOUNTER — Ambulatory Visit: Payer: BC Managed Care – PPO | Attending: Internal Medicine

## 2019-03-29 DIAGNOSIS — Z20822 Contact with and (suspected) exposure to covid-19: Secondary | ICD-10-CM | POA: Diagnosis not present

## 2019-03-30 LAB — NOVEL CORONAVIRUS, NAA: SARS-CoV-2, NAA: NOT DETECTED

## 2019-06-02 ENCOUNTER — Other Ambulatory Visit: Payer: Self-pay

## 2019-06-02 ENCOUNTER — Encounter (HOSPITAL_BASED_OUTPATIENT_CLINIC_OR_DEPARTMENT_OTHER): Payer: Self-pay | Admitting: *Deleted

## 2019-06-02 ENCOUNTER — Emergency Department (HOSPITAL_BASED_OUTPATIENT_CLINIC_OR_DEPARTMENT_OTHER)
Admission: EM | Admit: 2019-06-02 | Discharge: 2019-06-02 | Disposition: A | Discharge status: Home / Self Care | Payer: BC Managed Care – PPO | Attending: Emergency Medicine | Admitting: Emergency Medicine

## 2019-06-02 DIAGNOSIS — R42 Dizziness and giddiness: Secondary | ICD-10-CM | POA: Insufficient documentation

## 2019-06-02 DIAGNOSIS — Z79899 Other long term (current) drug therapy: Secondary | ICD-10-CM | POA: Diagnosis not present

## 2019-06-02 DIAGNOSIS — R079 Chest pain, unspecified: Secondary | ICD-10-CM | POA: Insufficient documentation

## 2019-06-02 DIAGNOSIS — R0789 Other chest pain: Secondary | ICD-10-CM | POA: Diagnosis not present

## 2019-06-02 LAB — BASIC METABOLIC PANEL
Anion gap: 9 (ref 5–15)
BUN: 10 mg/dL (ref 6–20)
CO2: 26 mmol/L (ref 22–32)
Calcium: 9.9 mg/dL (ref 8.9–10.3)
Chloride: 104 mmol/L (ref 98–111)
Creatinine, Ser: 0.78 mg/dL (ref 0.44–1.00)
GFR calc Af Amer: >60 mL/min (ref >60)
GFR calc non Af Amer: >60 mL/min (ref >60)
Glucose, Bld: 110 mg/dL — ABNORMAL HIGH (ref 70–99)
Potassium: 4.4 mmol/L (ref 3.5–5.1)
Sodium: 139 mmol/L (ref 135–145)

## 2019-06-02 LAB — CBC WITH DIFFERENTIAL/PLATELET
Abs Immature Granulocytes: 0.01 10*3/uL (ref 0.00–0.07)
Basophils Absolute: 0 10*3/uL (ref 0.0–0.1)
Basophils Relative: 0 %
Eosinophils Absolute: 0.1 10*3/uL (ref 0.0–0.5)
Eosinophils Relative: 1 %
HCT: 42.7 % (ref 36.0–46.0)
Hemoglobin: 14.1 g/dL (ref 12.0–15.0)
Immature Granulocytes: 0 %
Lymphocytes Relative: 40 %
Lymphs Abs: 2 10*3/uL (ref 0.7–4.0)
MCH: 29.9 pg (ref 26.0–34.0)
MCHC: 33 g/dL (ref 30.0–36.0)
MCV: 90.7 fL (ref 80.0–100.0)
Monocytes Absolute: 0.3 10*3/uL (ref 0.1–1.0)
Monocytes Relative: 6 %
Neutro Abs: 2.6 10*3/uL (ref 1.7–7.7)
Neutrophils Relative %: 53 %
Platelets: 192 10*3/uL (ref 150–400)
RBC: 4.71 MIL/uL (ref 3.87–5.11)
RDW: 15.2 % (ref 11.5–15.5)
WBC: 4.9 10*3/uL (ref 4.0–10.5)
nRBC: 0 % (ref 0.0–0.2)

## 2019-06-02 LAB — URINALYSIS, ROUTINE W REFLEX MICROSCOPIC
Bilirubin Urine: NEGATIVE
Glucose, UA: NEGATIVE mg/dL
Hgb urine dipstick: NEGATIVE
Ketones, ur: NEGATIVE mg/dL
Nitrite: NEGATIVE
Protein, ur: NEGATIVE mg/dL
Specific Gravity, Urine: 1.01 (ref 1.005–1.030)
pH: 5.5 (ref 5.0–8.0)

## 2019-06-02 LAB — TROPONIN I (HIGH SENSITIVITY): Troponin I (High Sensitivity): <2 ng/L (ref <18)

## 2019-06-02 LAB — URINALYSIS, MICROSCOPIC (REFLEX): RBC / HPF: NONE SEEN RBC/hpf (ref 0–5)

## 2019-06-02 LAB — D-DIMER, QUANTITATIVE: D-Dimer, Quant: <0.27 ug/mL-FEU (ref 0.00–0.50)

## 2019-06-02 MED ORDER — MECLIZINE HCL 12.5 MG PO TABS: 12.5000 mg | ORAL_TABLET | Freq: Three times a day (TID) | ORAL | 0 refills | Status: DC | PRN

## 2019-06-02 NOTE — Discharge Instructions (Addendum)
Per our discussion, I am prescribing you meclizine to take as needed for your dizziness.  You can take this up to 3 times a day.  We also discussed its side effects which are similar to Benadryl.  You may get dry mouth, fatigue, constipation.  Please be sure to eat a diet that is rich in fiber and to adequately hydrate.  Please follow-up with your primary care provider regarding this visit tomorrow.  Additionally, if you have any new or worsening symptoms please feel free to return to the emergency department for reevaluation.  It was a pleasure to meet you.

## 2019-06-02 NOTE — ED Provider Notes (Signed)
[facility] EMERGENCY DEPARTMENT Provider Note   CSN: [id number] Arrival date & time: 06/02/19  1344     History Chief Complaint  Patient presents with   Chest Pain    Kathryn Farley is a 49 y.o. female.  HPI HPI Comments: Kathryn Farley is a 49 y.o. female with no pertinent medical history who presents to the Emergency Department complaining of lightheadedness and chest pain.  Patient states she received the first [place] vaccination for COVID-19 2 days ago.  Early this morning she states she began feeling lightheaded with a feeling of disequilibrium.  She denies a sensation as if the room is spinning.  Her symptoms worsen when standing. She additionally notes moderate pain along her left anterior chest that began about 3 to 4 hours ago.  She describes it as a heaviness and notes that it worsens with palpation and deep breathing.  She has not taken anything for symptoms today.  She was initially experiencing mild pain and swelling in the left upper arm yesterday and took Tylenol for this with short-term relief.  She reports associated fatigue and chills.  She denies any fevers, URI or cold-like symptoms, body aches, shortness of breath, abdominal pain, nausea, vomiting, diarrhea, urinary changes, syncope.     Past Medical History:  Diagnosis Date   Fibroids     Patient Active Problem List   Diagnosis Date Noted   Breast nodule 08/23/2012   Fibroids 10/09/2011    Past Surgical History:  Procedure Laterality Date   TOE SURGERY  1996     OB History    Gravida  2   Para  2   Term  2   Preterm      AB      Living  2     SAB      TAB      Ectopic      Multiple      Live Births  2           Family History  Problem Relation Age of Onset   Ovarian cancer Mother 46   Stroke Sister 39   Diabetes Father     Social History   Tobacco Use   Smoking status: Never Smoker   Smokeless tobacco: Never Used  Substance Use Topics    Alcohol use: No   Drug use: No    Home Medications Prior to Admission medications   Medication Sig Start Date End Date Taking? Authorizing Provider  Multiple Vitamin (MULTIVITAMIN) capsule Take 1 capsule by mouth daily.    [provider]    Allergies    Fluconazole in dextrose and Metronidazole  Review of Systems   Review of Systems  All other systems reviewed and are negative.  Ten systems reviewed and are negative for acute change, except as noted in the HPI.   Physical Exam Updated Vital Signs BP 122/69    Pulse 79    Temp 98.4 F (36.9 C) (Oral)    Resp 18    Ht 5\' 8"  (1.727 m)    Wt 79.4 kg    LMP  (LMP Unknown)    SpO2 99%    BMI 26.61 kg/m   Physical Exam Vitals and nursing note reviewed.  Constitutional:      General: She is not in acute distress.    Appearance: She is well-developed and normal weight. She is not ill-appearing, toxic-appearing or diaphoretic.  HENT:     Head: Normocephalic and atraumatic.  Eyes:     Extraocular Movements: Extraocular movements intact.     Pupils: Pupils are equal, round, and reactive to light.  Cardiovascular:     Rate and Rhythm: Normal rate and regular rhythm.     Pulses:          Radial pulses are 2+ on the right side and 2+ on the left side.       Dorsalis pedis pulses are 2+ on the right side and 2+ on the left side.     Heart sounds: Normal heart sounds. Heart sounds not distant. No murmur. No systolic murmur. No diastolic murmur. No friction rub. No gallop.   Pulmonary:     Effort: Pulmonary effort is normal. No tachypnea, accessory muscle usage or respiratory distress.     Breath sounds: Normal breath sounds. No stridor. No decreased breath sounds, wheezing, rhonchi or rales.  Chest:     Chest wall: Tenderness present. No mass, deformity, crepitus or edema.     Comments: Moderate TTP noted to the left anterior chest wall.  No signs of erythema or edema.  No visible signs of trauma. Abdominal:     Palpations:  Abdomen is soft.     Tenderness: There is no abdominal tenderness.  Musculoskeletal:        General: Normal range of motion.     Cervical back: Normal range of motion and neck supple.     Right lower leg: No tenderness. No edema.     Left lower leg: No tenderness. No edema.  Skin:    General: Skin is warm and dry.     Capillary Refill: Capillary refill takes less than 2 seconds.  Neurological:     General: No focal deficit present.     Mental Status: She is alert and oriented to person, place, and time.     Comments: Distal sensation intact.  Negative pronator drift.  Extraocular movements intact.  Finger-to-nose intact bilaterally with no visible signs of ataxia.  Patient able to move all extremities spontaneously and without difficulty.  Psychiatric:        Mood and Affect: Mood normal.        Behavior: Behavior normal.    ED Results / Procedures / Treatments   Labs (all labs ordered are listed, but only abnormal results are displayed) Labs Reviewed  BASIC METABOLIC PANEL - Abnormal; Notable for the following components:      Result Value   Glucose, Bld 110 (*)    All other components within normal limits  URINALYSIS, ROUTINE W REFLEX MICROSCOPIC - Abnormal; Notable for the following components:   Leukocytes,Ua TRACE (*)    All other components within normal limits  URINALYSIS, MICROSCOPIC (REFLEX) - Abnormal; Notable for the following components:   Bacteria, UA FEW (*)    All other components within normal limits  CBC WITH DIFFERENTIAL/PLATELET  D-DIMER, QUANTITATIVE (NOT AT [facility])  TROPONIN I (HIGH SENSITIVITY)    EKG EKG Interpretation  Date/Time:  Thursday June 02 2019 13:53:44 EDT Ventricular Rate:  82 PR Interval:    QRS Duration: 75 QT Interval:  390 QTC Calculation: 456 R Axis:   45 Text Interpretation: Sinus rhythm Abnormal R-wave progression, early transition No prior ecg for comparison Confirmed by Veryl Speak [phone]) on 06/02/2019 2:02:45  PM  Radiology No results found.  Procedures Procedures   Medications Ordered in ED Medications - No data to display  ED Course  I have reviewed the triage vital signs and the nursing notes.  Pertinent labs & imaging results that were available during my care of the patient were reviewed by me and considered in my medical decision making (see chart for details).    MDM Rules/Calculators/A&P                       2:59 PM patient is a pleasant 49 year old female complaining of some intermittent mild dizziness as well as left-sided chest pain.  Physical exam is generally reassuring.  Her neurological exam is benign.  Patient is a bit anxious due to being in the hospital.  ECG is reassuring.  Will obtain basic labs, D-dimer and will reassess.  3:51 PM labs are all reassuring.  No elevation in troponin.  Negative D-dimer.  Patient's vital signs are still stable.  No tachycardia.  She is oxygenating well.  I discussed these lab results with the patient.  We also discussed starting her on meclizine as needed for the next few days.  Also discussed the recent Covid vaccination and her symptoms likely being secondary to this.  I recommended she rest for the next few days and stay adequately hydrated.  She is going to follow-up with her primary care provider tomorrow regarding this visit.  She was given strict return precautions and understands return to the emergency department any new or worsening symptoms.  Her questions were answered and she was amicable at the time of discharge.  Final Clinical Impression(s) / ED Diagnoses Final diagnoses:  Chest pain, unspecified type  Episodic lightheadedness    Rx / DC Orders ED Discharge Orders         Ordered    meclizine (ANTIVERT) 12.5 MG tablet  3 times daily PRN     06/02/19 1606           Rayna Sexton, PA-C 06/02/19 1612    Veryl Speak, MD 06/03/19 [phone]

## 2019-06-02 NOTE — ED Triage Notes (Addendum)
Pt c/o  Left sided chest pain with deep breath  and dizziness  x1 day , received 1st covid injection x 2 days

## 2019-06-13 DIAGNOSIS — L500 Allergic urticaria: Secondary | ICD-10-CM | POA: Diagnosis not present

## 2019-06-13 DIAGNOSIS — T50B95A Adverse effect of other viral vaccines, initial encounter: Secondary | ICD-10-CM | POA: Diagnosis not present

## 2019-06-17 DIAGNOSIS — L500 Allergic urticaria: Secondary | ICD-10-CM | POA: Diagnosis not present

## 2019-06-17 DIAGNOSIS — T50B95A Adverse effect of other viral vaccines, initial encounter: Secondary | ICD-10-CM | POA: Diagnosis not present

## 2019-09-01 DIAGNOSIS — M5136 Other intervertebral disc degeneration, lumbar region: Secondary | ICD-10-CM | POA: Diagnosis not present

## 2019-09-22 DIAGNOSIS — Z0000 Encounter for general adult medical examination without abnormal findings: Secondary | ICD-10-CM | POA: Diagnosis not present

## 2019-09-22 DIAGNOSIS — Z1211 Encounter for screening for malignant neoplasm of colon: Secondary | ICD-10-CM | POA: Diagnosis not present

## 2019-09-22 DIAGNOSIS — Z6827 Body mass index (BMI) 27.0-27.9, adult: Secondary | ICD-10-CM | POA: Diagnosis not present

## 2019-09-30 DIAGNOSIS — Z20822 Contact with and (suspected) exposure to covid-19: Secondary | ICD-10-CM | POA: Diagnosis not present

## 2019-09-30 DIAGNOSIS — Z03818 Encounter for observation for suspected exposure to other biological agents ruled out: Secondary | ICD-10-CM | POA: Diagnosis not present

## 2019-11-15 DIAGNOSIS — M1712 Unilateral primary osteoarthritis, left knee: Secondary | ICD-10-CM | POA: Diagnosis not present

## 2019-11-15 DIAGNOSIS — M25562 Pain in left knee: Secondary | ICD-10-CM | POA: Diagnosis not present

## 2019-12-05 ENCOUNTER — Encounter: Payer: Self-pay | Admitting: Nurse Practitioner

## 2019-12-05 ENCOUNTER — Ambulatory Visit (INDEPENDENT_AMBULATORY_CARE_PROVIDER_SITE_OTHER): Payer: BC Managed Care – PPO | Admitting: Nurse Practitioner

## 2019-12-05 ENCOUNTER — Other Ambulatory Visit: Payer: Self-pay

## 2019-12-05 VITALS — BP 115/78 | Ht 68.0 in | Wt 179.2 lb

## 2019-12-05 DIAGNOSIS — Z01419 Encounter for gynecological examination (general) (routine) without abnormal findings: Secondary | ICD-10-CM | POA: Diagnosis not present

## 2019-12-05 NOTE — Addendum Note (Signed)
Addended by: Thamas Jaegers on: 12/05/2019 08:56 AM   Modules accepted: Orders

## 2019-12-05 NOTE — Patient Instructions (Addendum)

## 2019-12-05 NOTE — Progress Notes (Signed)
   Kathryn Farley 51/08/1970 [id number]   History:  49 y.o. G2P2002 presents for annual exam. Postmenopausal - no HRT, no bleeding. Normal pap and mammogram history. History of uterine fibroids and benign cervical polyps. Mother with history of probable uterine cancer.   Gynecologic History No LMP recorded (lmp unknown). Patient is postmenopausal.   Contraception: post menopausal status Last Pap: 03/28/2015. Results were: normal Last mammogram: 01/20/2019. Results were: normal Last colonoscopy: Never   Past medical history, past surgical history, family history and social history were all reviewed and documented in the EPIC chart.  ROS:  A ROS was performed and pertinent positives and negatives are included.  Exam:  Vitals:   12/05/19 0832  BP: 115/78  Weight: 179 lb 3.2 oz (81.3 kg)  Height: 5\' 8"  (1.727 m)   Body mass index is 27.25 kg/m.  General appearance:  Normal Thyroid:  Symmetrical, normal in size, without palpable masses or nodularity. Respiratory  Auscultation:  Clear without wheezing or rhonchi Cardiovascular  Auscultation:  Regular rate, without rubs, murmurs or gallops  Edema/varicosities:  Not grossly evident Abdominal  Soft,nontender, without masses, guarding or rebound.  Liver/spleen:  No organomegaly noted  Hernia:  None appreciated  Skin  Inspection:  Grossly normal   Breasts: Examined lying and sitting.   Right: Without masses, retractions, discharge or axillary adenopathy.   Left: Without masses, retractions, discharge or axillary adenopathy. Gentitourinary   Inguinal/mons:  Normal without inguinal adenopathy  External genitalia:  Normal  BUS/Urethra/Skene's glands:  Normal  Vagina:  Normal  Cervix:  Normal  Uterus:  Normal in size, shape and contour.  Midline and mobile  Adnexa/parametria:     Rt: Without masses or tenderness.   Lt: Without masses or tenderness.  Anus and perineum: Normal  Digital rectal exam: Normal sphincter tone  without palpated masses or tenderness  Assessment/Plan:  49 y.o. [id number] for annual exam.   Well woman exam with routine gynecological exam - Education provided on SBEs, importance of preventative screenings, current guidelines, high calcium diet, regular exercise, and multivitamin daily. Labs with PCP.   Screening for cervical cancer - normal pap history. Pap with HPV typing today.   Screening for breast cancer - Normal mammogram history. Normal breast exam today.   Screening for colon cancer - Scheduled for screening colonoscopy in a couple of weeks.  Follow up in 1 year for annual.       Tamela Gammon [facility], 8:39 AM 12/05/2019

## 2019-12-07 DIAGNOSIS — B029 Zoster without complications: Secondary | ICD-10-CM | POA: Diagnosis not present

## 2019-12-08 LAB — PAP, TP IMAGING W/ HPV RNA, RFLX HPV TYPE 16,18/45: HPV DNA High Risk: NOT DETECTED

## 2019-12-14 DIAGNOSIS — B029 Zoster without complications: Secondary | ICD-10-CM | POA: Diagnosis not present

## 2020-01-20 DIAGNOSIS — M1712 Unilateral primary osteoarthritis, left knee: Secondary | ICD-10-CM | POA: Diagnosis not present

## 2020-01-20 DIAGNOSIS — M25562 Pain in left knee: Secondary | ICD-10-CM | POA: Diagnosis not present

## 2020-01-26 DIAGNOSIS — M1712 Unilateral primary osteoarthritis, left knee: Secondary | ICD-10-CM | POA: Diagnosis not present

## 2020-01-26 DIAGNOSIS — M25662 Stiffness of left knee, not elsewhere classified: Secondary | ICD-10-CM | POA: Diagnosis not present

## 2020-01-30 DIAGNOSIS — M25562 Pain in left knee: Secondary | ICD-10-CM | POA: Diagnosis not present

## 2020-03-08 ENCOUNTER — Other Ambulatory Visit: Payer: Self-pay | Admitting: Nurse Practitioner

## 2020-03-08 DIAGNOSIS — Z1231 Encounter for screening mammogram for malignant neoplasm of breast: Secondary | ICD-10-CM

## 2020-04-24 ENCOUNTER — Ambulatory Visit: Payer: BC Managed Care – PPO

## 2020-05-07 ENCOUNTER — Ambulatory Visit: Payer: BC Managed Care – PPO

## 2020-06-27 ENCOUNTER — Other Ambulatory Visit: Payer: Self-pay

## 2020-06-27 ENCOUNTER — Ambulatory Visit
Admission: RE | Admit: 2020-06-27 | Discharge: 2020-06-27 | Disposition: A | Discharge status: Home / Self Care | Payer: BC Managed Care – PPO | Source: Ambulatory Visit | Attending: Nurse Practitioner | Admitting: Nurse Practitioner

## 2020-06-27 DIAGNOSIS — Z1231 Encounter for screening mammogram for malignant neoplasm of breast: Secondary | ICD-10-CM

## 2020-07-25 ENCOUNTER — Ambulatory Visit (HOSPITAL_BASED_OUTPATIENT_CLINIC_OR_DEPARTMENT_OTHER): Payer: BC Managed Care – PPO | Attending: Family Medicine | Admitting: Physical Therapy

## 2020-07-25 ENCOUNTER — Other Ambulatory Visit: Payer: Self-pay

## 2020-07-25 DIAGNOSIS — M25662 Stiffness of left knee, not elsewhere classified: Secondary | ICD-10-CM | POA: Diagnosis present

## 2020-07-25 DIAGNOSIS — M6281 Muscle weakness (generalized): Secondary | ICD-10-CM | POA: Insufficient documentation

## 2020-07-25 DIAGNOSIS — G8929 Other chronic pain: Secondary | ICD-10-CM | POA: Diagnosis present

## 2020-07-25 DIAGNOSIS — M25562 Pain in left knee: Secondary | ICD-10-CM | POA: Diagnosis not present

## 2020-07-25 NOTE — Therapy (Signed)
[place], [place], [address] Phone: [phone]   Fax:  [phone]  Physical Therapy Treatment  Patient Details  Name: Kathryn Farley MRN: [id number] Date of Birth: 04-Sep-1970 Referring Provider (PT): Gentry Fitz, MD   Encounter Date: 07/25/2020   PT End of Session - 07/25/20 0802     Visit Number 1    Number of Visits 13    Date for PT Re-Evaluation 09/05/20    Authorization Type BCBS 60 visit limit    PT Start Time 0802    PT Stop Time 0845    PT Time Calculation (min) 43 min    Activity Tolerance Patient tolerated treatment well    Behavior During Therapy [facility] for tasks assessed/performed             Past Medical History:  Diagnosis Date   Fibroids     Past Surgical History:  Procedure Laterality Date   TOE SURGERY  1996    There were no vitals filed for this visit.   Subjective Assessment - 07/25/20 0807     Subjective Pt states she was diagnosed with OA -- most difficulty with stairs and trouble sleeping. It has gotten to the point it has become very painful in the last year. Pt has tried steroid shots which were effective in the short term but does not want to do this regularly. Pt's most recent shot on Monday and then the final one 08/06/20. Prior to injections she did not have pain all the time -- since the injections it's always painful. She wants surgery as her last resort. Pt does ice 3x/day for 20 min. Used to ride bike and now painful.    Limitations Lifting;House hold activities    How long can you sit comfortably? No issues    How long can you stand comfortably? Only after long periods    How long can you walk comfortably? Only after long periods    Patient Stated Goals Decrease knee pain    Currently in Pain? Yes    Pain Score 3    10/10 at worst at night   Pain Location Knee    Pain Orientation Left;Anterior;Medial    Pain Descriptors / Indicators Stabbing     Pain Type Chronic pain    Pain Onset More than a month ago    Pain Frequency Constant    Aggravating Factors  Stairs    Pain Relieving Factors Ice, aquatics                [facility] PT Assessment - 07/25/20 0001       Assessment   Medical Diagnosis Left knee primary osteoarthritis and pain    Referring Provider (PT) Gentry Fitz, MD    Prior Therapy None      Precautions   Precautions None      Restrictions   Weight Bearing Restrictions No      Balance Screen   Has the patient fallen in the past 6 months No      [place] residence    Living Arrangements Spouse/significant other    Available Help at Discharge Family    Type of [place] to enter    Entrance Stairs-Number of Steps 3      Prior Function   Level of Independence Independent    Vocation Full time employment    Vocation Requirements Book keeping  Cognition   Overall Cognitive Status Within Functional Limits for tasks assessed      Observation/Other Assessments   Focus on Therapeutic Outcomes (FOTO)  n/a      Observation/Other Assessments-Edema    Edema Circumferential      Circumferential Edema   Circumferential - Right 37 cm    Circumferential - Left  39 cm      Functional Tests   Functional tests Squat;Single leg stance      Squat   Comments Decreased L weight shift; knees forward; pain at 50%, increased L knee valgus      Single Leg Stance   Comments R LE: >30 sec; L LE: 15 sec (demos [place] and knees shaking)      ROM / Strength   AROM / PROM / Strength AROM;Strength      AROM   AROM Assessment Site Knee    Right/Left Knee Right;Left    Right Knee Extension 0    Right Knee Flexion 130    Left Knee Extension -5   Pain and presure at end range   Left Knee Flexion 120      Strength   Strength Assessment Site Hip;Knee    Right/Left Hip Right;Left    Right Hip Flexion 4+/5    Right Hip Extension 4+/5     Right Hip External Rotation  4+/5    Right Hip Internal Rotation 4+/5    Right Hip ABduction 4+/5    Left Hip Flexion 4/5    Left Hip Extension 4/5    Left Hip External Rotation 4-/5    Left Hip Internal Rotation 4-/5    Left Hip ABduction 3+/5    Left Hip ADduction 3/5   painful with attempt     Palpation   Palpation comment TTP distal L hip adductor muscles and medial knee      Special Tests   Other special tests Pt's knee too inflamed to attempt ligamentous testing      Ambulation/Gait   Gait Pattern Step-through pattern;Decreased weight shift to left;Decreased dorsiflexion - left;Decreased stance time - left;Antalgic                                   PT Education - 07/25/20 1312     Education Details Discussed exam findings, POC, HEP. Reinforced edema management with RICE and keeping knee extended. Discussed knee positioning to decrease valgus forces to her medial knee.    Person(s) Educated Patient    Methods Demonstration;Explanation;Tactile cues;Verbal cues;Handout    Comprehension Verbalized understanding;Returned demonstration;Verbal cues required;Tactile cues required              PT Short Term Goals - 07/25/20 1321       PT SHORT TERM GOAL #1   Title Pt will be independent with initial HEP    Time 3    Period Weeks    Status New    Target Date 08/15/20      PT SHORT TERM GOAL #2   Title Pt will have decrease in L knee edema by at least 1 cm    Time 3    Period Weeks    Status New    Target Date 08/15/20      PT SHORT TERM GOAL #3   Title Pt will be able to tolerate standing on L LE for at least 20 sec to demo improved stability    Time 3  Period Weeks    Status New    Target Date 08/15/20               PT Long Term Goals - 07/25/20 1324       PT LONG TERM GOAL #1   Title Pt will be independent with self progressing knee strengthening    Time 6    Period Weeks    Status New    Target Date 09/05/20      PT  LONG TERM GOAL #2   Title Pt will have improved L knee ROM by at least 5 deg for improved function with steps    Time 6    Period Weeks    Status New    Target Date 09/05/20      PT LONG TERM GOAL #3   Title Pt will be able to ascend/descend steps with at least 50% less pain than normal    Time 6    Period Weeks    Status New    Target Date 09/05/20      PT LONG TERM GOAL #4   Title Pt will be able to perform at least 10 squats with 10 lbs to demo improved functional strength    Baseline Unable without compensation    Time 6    Period Weeks    Status New    Target Date 09/05/20                   Plan - 07/25/20 1312     Clinical Impression Statement Kathryn Farley is a 50 y/o F presenting to OPPT due to complaint of worsening L knee pain. Pt reports history of OA. Has been receiving injections for her knee and would like to avoid surgery as much as possible. On assessment, pt with increased L knee edema, increased knee valgus with knee bending tasks, and L>R LE weakness with less L knee ROM vs R. Pt would benefit from PT to decrease her pain and optimize her level of function for home, work, community, and leisure activities.    Personal Factors and Comorbidities Age;Fitness;Time since onset of injury/illness/exacerbation    Examination-Activity Limitations Locomotion Level;Transfers;Stairs;Stand;Lift    Examination-Participation Restrictions Cleaning;Community Activity;Yard Work;Shop    Stability/Clinical Decision Making Stable/Uncomplicated    Clinical Decision Making Low    Rehab Potential Good    PT Frequency 2x / week    PT Duration 6 weeks    PT Treatment/Interventions ADLs/Self Care Home Management;Aquatic Therapy;Cryotherapy;Electrical Stimulation;Iontophoresis 4mg /ml Dexamethasone;Moist Heat;Ultrasound;Gait training;Stair training;Functional mobility training;Therapeutic activities;Therapeutic exercise;Balance training;Neuromuscular re-education;Patient/family  education;Manual techniques;Passive range of motion;Dry needling;Taping;Vasopneumatic Device;Joint Manipulations    PT Next Visit Plan Assess response to HEP. Manual as indicated for knee/hip. Work on knee stability, control, and strengthening. Work on Air[employer] pilot.    PT Home Exercise Plan Access Code [phone]    Consulted and Agree with Plan of Care Patient             Patient will benefit from skilled therapeutic intervention in order to improve the following deficits and impairments:  Decreased range of motion, Difficulty walking, Pain, Decreased balance, Decreased mobility, Decreased strength, Increased edema, Abnormal gait  Visit Diagnosis: Chronic pain of left knee  Stiffness of left knee, not elsewhere classified     Problem List Patient Active Problem List   Diagnosis Date Noted   Breast nodule 08/23/2012   Fibroids 10/09/2011    Will Schier April Ma L Jean Skow PT, DPT 07/25/2020, 1:28 PM  [place]  [place], [place], [address] Phone: [phone]   Fax:  [phone]  Name: Kathryn Farley MRN: [id number] Date of Birth: 1970-07-28

## 2020-08-06 ENCOUNTER — Other Ambulatory Visit: Payer: Self-pay

## 2020-08-06 ENCOUNTER — Ambulatory Visit (HOSPITAL_BASED_OUTPATIENT_CLINIC_OR_DEPARTMENT_OTHER): Payer: BC Managed Care – PPO | Admitting: Physical Therapy

## 2020-08-06 ENCOUNTER — Encounter (HOSPITAL_BASED_OUTPATIENT_CLINIC_OR_DEPARTMENT_OTHER): Payer: Self-pay | Admitting: Physical Therapy

## 2020-08-06 DIAGNOSIS — M6281 Muscle weakness (generalized): Secondary | ICD-10-CM

## 2020-08-06 DIAGNOSIS — M25662 Stiffness of left knee, not elsewhere classified: Secondary | ICD-10-CM

## 2020-08-06 DIAGNOSIS — M25562 Pain in left knee: Secondary | ICD-10-CM | POA: Diagnosis not present

## 2020-08-06 DIAGNOSIS — G8929 Other chronic pain: Secondary | ICD-10-CM

## 2020-08-06 NOTE — Therapy (Signed)
[place], [place], [address] Phone: [phone]   Fax:  [phone]  Physical Therapy Treatment  Patient Details  Name: Kathryn Farley MRN: [id number] Date of Birth: 06-Nov-1970 Referring Provider (PT): Gentry Fitz, MD   Encounter Date: 08/06/2020   PT End of Session - 08/06/20 0840     Visit Number 2    Number of Visits 13    Date for PT Re-Evaluation 09/05/20    Authorization Type BCBS 60 visit limit    PT Start Time 0801    PT Stop Time 0840    PT Time Calculation (min) 39 min    Activity Tolerance Patient tolerated treatment well    Behavior During Therapy [facility] for tasks assessed/performed             Past Medical History:  Diagnosis Date   Fibroids     Past Surgical History:  Procedure Laterality Date   TOE SURGERY  1996    There were no vitals filed for this visit.   Subjective Assessment - 08/06/20 0803     Subjective I have been out of town for about 10 days so it is stiff.    Currently in Pain? Yes    Pain Score 4     Pain Location Knee    Pain Orientation Left;Medial    Pain Descriptors / Indicators Stabbing;Aching    Aggravating Factors  stabbing laying down at night    Pain Relieving Factors rest                               [facility] Adult PT Treatment/Exercise - 08/06/20 0001       Exercises   Exercises Knee/Hip      Knee/Hip Exercises: Stretches   Other Knee/Hip Stretches seated: hamstring & hip flexor    Other Knee/Hip Stretches standing adductor & gastroc, gastroc lant board      Knee/Hip Exercises: Machines for Strengthening   Other Machine abd/add      Knee/Hip Exercises: Sidelying   Hip ABduction Both;20 reps                      PT Short Term Goals - 07/25/20 1321       PT SHORT TERM GOAL #1   Title Pt will be independent with initial HEP    Time 3    Period Weeks    Status New    Target Date 08/15/20       PT SHORT TERM GOAL #2   Title Pt will have decrease in L knee edema by at least 1 cm    Time 3    Period Weeks    Status New    Target Date 08/15/20      PT SHORT TERM GOAL #3   Title Pt will be able to tolerate standing on L LE for at least 20 sec to demo improved stability    Time 3    Period Weeks    Status New    Target Date 08/15/20               PT Long Term Goals - 07/25/20 1324       PT LONG TERM GOAL #1   Title Pt will be independent with self progressing knee strengthening    Time 6    Period Weeks    Status New  Target Date 09/05/20      PT LONG TERM GOAL #2   Title Pt will have improved L knee ROM by at least 5 deg for improved function with steps    Time 6    Period Weeks    Status New    Target Date 09/05/20      PT LONG TERM GOAL #3   Title Pt will be able to ascend/descend steps with at least 50% less pain than normal    Time 6    Period Weeks    Status New    Target Date 09/05/20      PT LONG TERM GOAL #4   Title Pt will be able to perform at least 10 squats with 10 lbs to demo improved functional strength    Baseline Unable without compensation    Time 6    Period Weeks    Status New    Target Date 09/05/20                   Plan - 08/06/20 0825     Clinical Impression Statement restructured stretches today to improve ability to perform them throughout the day. will be doing aquatics 1/week and land 1/week.    PT Treatment/Interventions ADLs/Self Care Home Management;Aquatic Therapy;Cryotherapy;Electrical Stimulation;Iontophoresis 4mg /ml Dexamethasone;Moist Heat;Ultrasound;Gait training;Stair training;Functional mobility training;Therapeutic activities;Therapeutic exercise;Balance training;Neuromuscular re-education;Patient/family education;Manual techniques;Passive range of motion;Dry needling;Taping;Vasopneumatic Device;Joint Manipulations    PT Next Visit Plan hip abd focus in pool, core; land- [facility]    PT Home Exercise  Plan Access Code [email or web address]    Consulted and Agree with Plan of Care Patient             Patient will benefit from skilled therapeutic intervention in order to improve the following deficits and impairments:  Decreased range of motion, Difficulty walking, Pain, Decreased balance, Decreased mobility, Decreased strength, Increased edema, Abnormal gait  Visit Diagnosis: Chronic pain of left knee  Stiffness of left knee, not elsewhere classified  Muscle weakness (generalized)     Problem List Patient Active Problem List   Diagnosis Date Noted   Breast nodule 08/23/2012   Fibroids 10/09/2011   Rashae Rother C. Aileen Amore PT, DPT 08/06/20 8:41 AM  [facility] [place], [place], [address] Phone: [phone]   Fax:  [phone]  Name: Kathryn Farley MRN: [id number] Date of Birth: 1970-10-16

## 2020-08-10 ENCOUNTER — Encounter (HOSPITAL_BASED_OUTPATIENT_CLINIC_OR_DEPARTMENT_OTHER): Payer: Self-pay | Admitting: Physical Therapy

## 2020-08-10 ENCOUNTER — Other Ambulatory Visit: Payer: Self-pay

## 2020-08-10 ENCOUNTER — Ambulatory Visit (HOSPITAL_BASED_OUTPATIENT_CLINIC_OR_DEPARTMENT_OTHER): Payer: BC Managed Care – PPO | Attending: Family Medicine | Admitting: Physical Therapy

## 2020-08-10 DIAGNOSIS — M25662 Stiffness of left knee, not elsewhere classified: Secondary | ICD-10-CM | POA: Insufficient documentation

## 2020-08-10 DIAGNOSIS — G8929 Other chronic pain: Secondary | ICD-10-CM | POA: Insufficient documentation

## 2020-08-10 DIAGNOSIS — M6281 Muscle weakness (generalized): Secondary | ICD-10-CM | POA: Diagnosis present

## 2020-08-10 DIAGNOSIS — M25562 Pain in left knee: Secondary | ICD-10-CM | POA: Diagnosis not present

## 2020-08-10 NOTE — Therapy (Signed)
[place], [place], [address] Phone: [phone]   Fax:  [phone]  Physical Therapy Treatment  Patient Details  Name: Kathryn Farley MRN: [id number] Date of Birth: July 28, 1970 Referring Provider (PT): Gentry Fitz, MD   Encounter Date: 08/10/2020   PT End of Session - 08/10/20 0816     Visit Number 3    Number of Visits 13    Date for PT Re-Evaluation 09/05/20    Authorization Type BCBS 60 visit limit    PT Start Time 0806    PT Stop Time 0844    PT Time Calculation (min) 38 min    Activity Tolerance Patient tolerated treatment well    Behavior During Therapy [facility] for tasks assessed/performed             Past Medical History:  Diagnosis Date   Fibroids     Past Surgical History:  Procedure Laterality Date   TOE SURGERY  1996    There were no vitals filed for this visit.   Subjective Assessment - 08/10/20 0814     Subjective still status quo but feels like there is less tightness.    Patient Stated Goals Decrease knee pain                    aquatic exercises: Resisted walking all directions Sumo squat + kick board press Neutral squat + noodle lat press down Hip circles- attempted holding kick board, moved to side Stretch with ankle floats: HS, quads, adductors                     PT Short Term Goals - 07/25/20 1321       PT SHORT TERM GOAL #1   Title Pt will be independent with initial HEP    Time 3    Period Weeks    Status New    Target Date 08/15/20      PT SHORT TERM GOAL #2   Title Pt will have decrease in L knee edema by at least 1 cm    Time 3    Period Weeks    Status New    Target Date 08/15/20      PT SHORT TERM GOAL #3   Title Pt will be able to tolerate standing on L LE for at least 20 sec to demo improved stability    Time 3    Period Weeks    Status New    Target Date 08/15/20               PT Long Term  Goals - 07/25/20 1324       PT LONG TERM GOAL #1   Title Pt will be independent with self progressing knee strengthening    Time 6    Period Weeks    Status New    Target Date 09/05/20      PT LONG TERM GOAL #2   Title Pt will have improved L knee ROM by at least 5 deg for improved function with steps    Time 6    Period Weeks    Status New    Target Date 09/05/20      PT LONG TERM GOAL #3   Title Pt will be able to ascend/descend steps with at least 50% less pain than normal    Time 6    Period Weeks    Status New    Target Date  09/05/20      PT LONG TERM GOAL #4   Title Pt will be able to perform at least 10 squats with 10 lbs to demo improved functional strength    Baseline Unable without compensation    Time 6    Period Weeks    Status New    Target Date 09/05/20                   Plan - 08/10/20 1149     Clinical Impression Statement was able to move LEs into ranges she has previously had fear of doing due to pain. notable lack of core engagement resulting in floating and losing balance in standing.    PT Treatment/Interventions ADLs/Self Care Home Management;Aquatic Therapy;Cryotherapy;Electrical Stimulation;Iontophoresis 4mg /ml Dexamethasone;Moist Heat;Ultrasound;Gait training;Stair training;Functional mobility training;Therapeutic activities;Therapeutic exercise;Balance training;Neuromuscular re-education;Patient/family education;Manual techniques;Passive range of motion;Dry needling;Taping;Vasopneumatic Device;Joint Manipulations    PT Next Visit Plan hip abd focus in pool, core; land- [facility], core program    PT Home Exercise Plan Access Code [email or web address]    Consulted and Agree with Plan of Care Patient             Patient will benefit from skilled therapeutic intervention in order to improve the following deficits and impairments:  Decreased range of motion, Difficulty walking, Pain, Decreased balance, Decreased mobility, Decreased strength, Increased  edema, Abnormal gait  Visit Diagnosis: Chronic pain of left knee  Stiffness of left knee, not elsewhere classified  Muscle weakness (generalized)     Problem List Patient Active Problem List   Diagnosis Date Noted   Breast nodule 08/23/2012   Fibroids 10/09/2011  Kentavius Dettore C. Miaisabella Bacorn PT, DPT 08/10/20 11:51 AM   [facility] [place], [place], [address] Phone: [phone]   Fax:  [phone]  Name: Kathryn Farley MRN: [id number] Date of Birth: 05-16-1970

## 2020-08-15 ENCOUNTER — Ambulatory Visit (HOSPITAL_BASED_OUTPATIENT_CLINIC_OR_DEPARTMENT_OTHER): Payer: BC Managed Care – PPO | Admitting: Physical Therapy

## 2020-08-17 ENCOUNTER — Ambulatory Visit (HOSPITAL_BASED_OUTPATIENT_CLINIC_OR_DEPARTMENT_OTHER): Payer: BC Managed Care – PPO | Admitting: Physical Therapy

## 2020-08-20 ENCOUNTER — Ambulatory Visit (HOSPITAL_BASED_OUTPATIENT_CLINIC_OR_DEPARTMENT_OTHER): Payer: BC Managed Care – PPO | Admitting: Physical Therapy

## 2020-08-20 ENCOUNTER — Encounter (HOSPITAL_BASED_OUTPATIENT_CLINIC_OR_DEPARTMENT_OTHER): Payer: Self-pay | Admitting: Physical Therapy

## 2020-08-20 ENCOUNTER — Other Ambulatory Visit: Payer: Self-pay

## 2020-08-20 DIAGNOSIS — M25562 Pain in left knee: Secondary | ICD-10-CM | POA: Diagnosis not present

## 2020-08-20 DIAGNOSIS — G8929 Other chronic pain: Secondary | ICD-10-CM

## 2020-08-20 DIAGNOSIS — M6281 Muscle weakness (generalized): Secondary | ICD-10-CM

## 2020-08-20 DIAGNOSIS — M25662 Stiffness of left knee, not elsewhere classified: Secondary | ICD-10-CM

## 2020-08-20 NOTE — Therapy (Signed)
[place], [place], [address] Phone: [phone]   Fax:  [phone]  Physical Therapy Treatment  Patient Details  Name: Kathryn Farley MRN: [id number] Date of Birth: [birth year] Referring Provider (PT): Gentry Fitz, MD   Encounter Date: 08/20/2020   PT End of Session - 08/20/20 0826     Visit Number 4    Number of Visits 13    Date for PT Re-Evaluation 09/05/20    PT Start Time 0825    PT Stop Time 0900    PT Time Calculation (min) 35 min    Equipment Utilized During Treatment Other (comment)   Ankle cuff and waist buoys, kick board, noodle/sqoodle, nekdoodle, weights and barbells   Activity Tolerance Patient tolerated treatment well    Behavior During Therapy [facility] for tasks assessed/performed             Past Medical History:  Diagnosis Date   Fibroids     Past Surgical History:  Procedure Laterality Date   TOE SURGERY  1996    There were no vitals filed for this visit.   Subjective Assessment - 08/20/20 0851     Subjective Stiff especially in the am, pain mostly at night med side of knee 4/10    Currently in Pain? Yes    Pain Score 4     Pain Location Knee    Pain Orientation Left;Medial    Pain Descriptors / Indicators Aching;Burning    Pain Type Chronic pain    Pain Onset More than a month ago               Pt seen for aquatic therapy today.  Treatment took place in water 3.25-4.8 ft in depth at the [employer] pool. Temp of water was 91.  Pt entered/exited the pool via stairs step to pattern independently with bilat rail. Warm up: forward, backward and side stepping/walking cues for increased step length, increased speed, hand placement to increase resistance.  Stretching: gastroc, hamstrings, glutes, and LB on wall and sitting on bench of pool Knee flex/ext with ankle cuff buoys cuing for increasing speed and able working in minimal pain.    Standing Step ups on bottom step leading with each LE, forward, lateral and backward x 10 reps. Short rest period between Noodle kick downs 3 x 10 reps in hip flex and external rotation. Holding to wall then progressing to hand buoy. Cueing for tightened abdominals and glutes Hip flex stretches and strengthening. Stretch 3 x 30 sec, str 2 x 10 reps pulling noodle towards wall Add/abd with ankle cuff buoy 2 x10 reps  Aerobic capacity training Fast water walking forward and back 4 widths each forward and backward.  Pt requires buoyancy for support and to offload joints with strengthening exercises. Viscosity of the water is needed for resistance of strengthening; water current perturbations provides challenge to standing balance unsupported, requiring increased core activation.                           PT Short Term Goals - 07/25/20 1321       PT SHORT TERM GOAL #1   Title Pt will be independent with initial HEP    Time 3    Period Weeks    Status New    Target Date 08/15/20      PT SHORT TERM GOAL #2   Title Pt will have decrease in L knee  edema by at least 1 cm    Time 3    Period Weeks    Status New    Target Date 08/15/20      PT SHORT TERM GOAL #3   Title Pt will be able to tolerate standing on L LE for at least 20 sec to demo improved stability    Time 3    Period Weeks    Status New    Target Date 08/15/20               PT Long Term Goals - 07/25/20 1324       PT LONG TERM GOAL #1   Title Pt will be independent with self progressing knee strengthening    Time 6    Period Weeks    Status New    Target Date 09/05/20      PT LONG TERM GOAL #2   Title Pt will have improved L knee ROM by at least 5 deg for improved function with steps    Time 6    Period Weeks    Status New    Target Date 09/05/20      PT LONG TERM GOAL #3   Title Pt will be able to ascend/descend steps with at least 50% less pain than normal    Time 6     Period Weeks    Status New    Target Date 09/05/20      PT LONG TERM GOAL #4   Title Pt will be able to perform at least 10 squats with 10 lbs to demo improved functional strength    Baseline Unable without compensation    Time 6    Period Weeks    Status New    Target Date 09/05/20                   Plan - 08/20/20 1329     Clinical Impression Statement Advanced ROM of knees into flex even further into ranges with exercises with little discomfort.  She has decreased fear of pain although moves completed exercises gingerly.  Was able to attain a good bilat hip and knee stretching and strengthening using noodles.  Did need to complete holding to pool wall due to core strength deficeits although is challenged to hold to hand buoy with cues for tightening of abdominals and glutes.    Personal Factors and Comorbidities Age;Fitness;Time since onset of injury/illness/exacerbation    Examination-Activity Limitations Locomotion Level;Transfers;Stairs;Stand;Lift    PT Treatment/Interventions ADLs/Self Care Home Management;Aquatic Therapy;Cryotherapy;Electrical Stimulation;Iontophoresis 4mg /ml Dexamethasone;Moist Heat;Ultrasound;Gait training;Stair training;Functional mobility training;Therapeutic activities;Therapeutic exercise;Balance training;Neuromuscular re-education;Patient/family education;Manual techniques;Passive range of motion;Dry needling;Taping;Vasopneumatic Device;Joint Manipulations    PT Home Exercise Plan Access Code [email or web address]    Consulted and Agree with Plan of Care Patient             Patient will benefit from skilled therapeutic intervention in order to improve the following deficits and impairments:  Decreased range of motion, Difficulty walking, Pain, Decreased balance, Decreased mobility, Decreased strength, Increased edema, Abnormal gait  Visit Diagnosis: Chronic pain of left knee  Stiffness of left knee, not elsewhere classified  Muscle weakness  (generalized)     Problem List Patient Active Problem List   Diagnosis Date Noted   Breast nodule 08/23/2012   Fibroids 10/09/2011    Vedia Pereyra MPT 08/20/2020, 1:41 PM  [place] [address] [place], [place], [address] Phone: [phone]   Fax:  [phone]  Name: Kathryn Farley MRN: [id number] Date of Birth: [birth year]

## 2020-08-24 ENCOUNTER — Other Ambulatory Visit: Payer: Self-pay

## 2020-08-24 ENCOUNTER — Ambulatory Visit (HOSPITAL_BASED_OUTPATIENT_CLINIC_OR_DEPARTMENT_OTHER): Payer: BC Managed Care – PPO | Admitting: Physical Therapy

## 2020-08-24 ENCOUNTER — Encounter (HOSPITAL_BASED_OUTPATIENT_CLINIC_OR_DEPARTMENT_OTHER): Payer: Self-pay | Admitting: Physical Therapy

## 2020-08-24 DIAGNOSIS — M25562 Pain in left knee: Secondary | ICD-10-CM | POA: Diagnosis not present

## 2020-08-24 DIAGNOSIS — G8929 Other chronic pain: Secondary | ICD-10-CM

## 2020-08-24 DIAGNOSIS — M6281 Muscle weakness (generalized): Secondary | ICD-10-CM

## 2020-08-24 DIAGNOSIS — M25662 Stiffness of left knee, not elsewhere classified: Secondary | ICD-10-CM

## 2020-08-24 NOTE — Therapy (Signed)
[place], [place], [address] Phone: [phone]   Fax:  [phone]  Physical Therapy Treatment/ERO  Patient Details  Name: Kathryn Farley MRN: [id number] Date of Birth: [birth year] Referring Provider (PT): Gentry Fitz, MD   Encounter Date: 08/24/2020   PT End of Session - 08/24/20 0852     Visit Number 5    Number of Visits 13    Date for PT Re-Evaluation 09/28/20    Authorization Type BCBS 60 visit limit    PT Start Time 0848    PT Stop Time 0927    PT Time Calculation (min) 39 min    Activity Tolerance Patient tolerated treatment well    Behavior During Therapy [facility] for tasks assessed/performed             Past Medical History:  Diagnosis Date   Fibroids     Past Surgical History:  Procedure Laterality Date   TOE SURGERY  1996    There were no vitals filed for this visit.   Subjective Assessment - 08/24/20 0850     Subjective So painful becuase we have been moving.    Patient Stated Goals Decrease knee pain    Currently in Pain? Yes    Pain Score 7     Pain Location Knee    Pain Orientation Left    Pain Descriptors / Indicators Aching    Aggravating Factors  stairs    Pain Relieving Factors rest                [facility] PT Assessment - 08/24/20 0001       Assessment   Medical Diagnosis Left knee primary osteoarthritis and pain    Referring Provider (PT) Gentry Fitz, MD      Home Environment   Additional Comments moving to two story, hand rails on one side      Prior Function   Level of Independence Independent    Vocation Full time employment    Vocation Requirements Book keeping      Cognition   Overall Cognitive Status Within Functional Limits for tasks assessed      Circumferential Edema   Circumferential - Right 33.5    Circumferential - Left  34.5      Sensation   Additional Comments WFL      Single Leg Stance   Comments LLE resolved  trendelenburg      AROM   Left Knee Extension 0    Left Knee Flexion 130      Strength   Right Hip Flexion 4+/5    Left Hip Flexion 4/5    Left Hip ABduction 4/5      Ambulation/Gait   Gait Comments antalgic on Lt today with increased pain                           [facility] Adult PT Treatment/Exercise - 08/24/20 0001       Knee/Hip Exercises: Aerobic   Stationary Bike 5 min      Manual Therapy   Manual Therapy Taping    McConnell medial patellar pull                    PT Education - 08/24/20 1547     Education Details goals, POC    Person(s) Educated Patient    Methods Explanation    Comprehension Verbalized understanding;Need further instruction  PT Short Term Goals - 08/24/20 1548       PT SHORT TERM GOAL #1   Title Pt will be independent with initial HEP    Status Achieved      PT SHORT TERM GOAL #2   Title Pt will have decrease in L knee edema by at least 1 cm    Status Achieved      PT SHORT TERM GOAL #3   Title Pt will be able to tolerate standing on L LE for at least 20 sec to demo improved stability    Baseline resolved trendelenburg but unable to stay stable for 20s    Status On-going               PT Long Term Goals - 07/25/20 1324       PT LONG TERM GOAL #1   Title Pt will be independent with self progressing knee strengthening    Time 6    Period Weeks    Status New    Target Date 09/05/20      PT LONG TERM GOAL #2   Title Pt will have improved L knee ROM by at least 5 deg for improved function with steps    Time 6    Period Weeks    Status New    Target Date 09/05/20      PT LONG TERM GOAL #3   Title Pt will be able to ascend/descend steps with at least 50% less pain than normal    Time 6    Period Weeks    Status New    Target Date 09/05/20      PT LONG TERM GOAL #4   Title Pt will be able to perform at least 10 squats with 10 lbs to demo improved functional strength    Baseline  Unable without compensation    Time 6    Period Weeks    Status New    Target Date 09/05/20                   Plan - 08/24/20 0962     Clinical Impression Statement Re-evaluation today. moving currently to a home with stairs so had incr pain. IASTM to decr knots in quads. Mcconnell tape to encourage medial tracking of patella which decr pain. Will cont with aquatic rehab for 4 weeks as she has shown significant improvement and feels it is the most helpful- will f/u on land when she completes this segment of treatments.    PT Frequency 2x / week    PT Duration --   5 wk   PT Treatment/Interventions ADLs/Self Care Home Management;Aquatic Therapy;Cryotherapy;Electrical Stimulation;Iontophoresis 4mg /ml Dexamethasone;Moist Heat;Ultrasound;Gait training;Stair training;Functional mobility training;Therapeutic activities;Therapeutic exercise;Balance training;Neuromuscular re-education;Patient/family education;Manual techniques;Passive range of motion;Dry needling;Taping;Vasopneumatic Device;Joint Manipulations    PT Next Visit Plan pool- core strenght, hip abd, quad stretching,    PT Home Exercise Plan Access Code [email or web address]    Consulted and Agree with Plan of Care Patient             Patient will benefit from skilled therapeutic intervention in order to improve the following deficits and impairments:  Decreased range of motion, Difficulty walking, Pain, Decreased balance, Decreased mobility, Decreased strength, Increased edema, Abnormal gait  Visit Diagnosis: Chronic pain of left knee - Plan: PT plan of care cert/re-cert  Stiffness of left knee, not elsewhere classified - Plan: PT plan of care cert/re-cert  Muscle weakness (generalized) - Plan: PT plan of care cert/re-cert  Problem List Patient Active Problem List   Diagnosis Date Noted   Breast nodule 08/23/2012   Fibroids 10/09/2011    Ginnie Marich C. Lunetta Marina PT, DPT 08/24/20 3:52 PM   [facility] [address] [place], [place], [address] Phone: [phone]   Fax:  [phone]  Name: Kathryn Farley MRN: [id number] Date of Birth: 1970-03-30

## 2020-08-27 ENCOUNTER — Encounter (HOSPITAL_BASED_OUTPATIENT_CLINIC_OR_DEPARTMENT_OTHER): Payer: Self-pay | Admitting: Physical Therapy

## 2020-08-27 ENCOUNTER — Other Ambulatory Visit: Payer: Self-pay

## 2020-08-27 ENCOUNTER — Ambulatory Visit (HOSPITAL_BASED_OUTPATIENT_CLINIC_OR_DEPARTMENT_OTHER): Payer: BC Managed Care – PPO | Admitting: Physical Therapy

## 2020-08-27 DIAGNOSIS — G8929 Other chronic pain: Secondary | ICD-10-CM

## 2020-08-27 DIAGNOSIS — M25562 Pain in left knee: Secondary | ICD-10-CM | POA: Diagnosis not present

## 2020-08-27 DIAGNOSIS — M25662 Stiffness of left knee, not elsewhere classified: Secondary | ICD-10-CM

## 2020-08-27 DIAGNOSIS — M6281 Muscle weakness (generalized): Secondary | ICD-10-CM

## 2020-08-27 NOTE — Therapy (Signed)
[place], [place], [address] Phone: [phone]   Fax:  [phone]  Physical Therapy Treatment  Patient Details  Name: Kathryn Farley MRN: [id number] Date of Birth: April 22, 1970 Referring Provider (PT): Gentry Fitz, MD   Encounter Date: 08/27/2020   PT End of Session - 08/27/20 0818     Visit Number 6    Number of Visits 13    Date for PT Re-Evaluation 09/28/20    Authorization Type BCBS 60 visit limit    PT Start Time 0817    PT Stop Time 0900    PT Time Calculation (min) 43 min             Past Medical History:  Diagnosis Date   Fibroids     Past Surgical History:  Procedure Laterality Date   TOE SURGERY  1996    There were no vitals filed for this visit.   Subjective Assessment - 08/27/20 0820     Subjective My knee is ok but I hurt my back moving this w/e.  Spent alot of time in the pool trying to get my back settled down.  Walked the steps at the old house about a dozen times without any icrease in knee pain.    Pain Score 4     Pain Orientation Left    Pain Descriptors / Indicators Aching           Pt seen for aquatic therapy today.  Treatment took place in water 3.25-4.8 ft in depth at the [facility] pool. Temp of water was 91.  Pt entered/exited the pool via stairs step to pattern independently with bilat rail. Warm up: forward, backward and side stepping/walking cues for increased step length, increased speed, hand placement to increase resistance.   Stretching: gastroc, hamstrings, glutes, and LB on wall and sitting on bench of pool.  Added LB stretching knees to chest stabilized by PT 3 x 30 sec. Knee flex/ext with ankle cuff buoys cuing for increasing speed and able working in minimal pain.   Standing Step ups on bottom step leading with each LE, forward, lateral and backward x 10 reps. LLE backward to 8 reps as pt with increased discomfort in knee.  Short rest period between Noodle kick downs 3 x 10 reps in hip flex and external rotation. Supported by hand buoys. Cueing for tightened abdominals and glutes Hip flex stretches and strengthening. Stretch 3 x 30 sec, str x 10 reps pulling noodle towards wall, modified reps due to LB discomfort Add/abd with ankle cuff buoy 2 x10 reps   Aerobic capacity training Fast water walking forward and back 4 widths each forward and backward.   Pt requires buoyancy for support and to offload joints with strengthening exercises. Viscosity of the water is needed for resistance of strengthening; water current perturbations provides challenge to standing balance unsupported, requiring increased core activation.                               PT Short Term Goals - 08/24/20 1548       PT SHORT TERM GOAL #1   Title Pt will be independent with initial HEP    Status Achieved      PT SHORT TERM GOAL #2   Title Pt will have decrease in L knee edema by at least 1 cm    Status Achieved      PT  SHORT TERM GOAL #3   Title Pt will be able to tolerate standing on L LE for at least 20 sec to demo improved stability    Baseline resolved trendelenburg but unable to stay stable for 20s    Status On-going               PT Long Term Goals - 07/25/20 1324       PT LONG TERM GOAL #1   Title Pt will be independent with self progressing knee strengthening    Time 6    Period Weeks    Status New    Target Date 09/05/20      PT LONG TERM GOAL #2   Title Pt will have improved L knee ROM by at least 5 deg for improved function with steps    Time 6    Period Weeks    Status New    Target Date 09/05/20      PT LONG TERM GOAL #3   Title Pt will be able to ascend/descend steps with at least 50% less pain than normal    Time 6    Period Weeks    Status New    Target Date 09/05/20      PT LONG TERM GOAL #4   Title Pt will be able to perform at least 10 squats with 10 lbs to demo  improved functional strength    Baseline Unable without compensation    Time 6    Period Weeks    Status New    Target Date 09/05/20                   Plan - 08/27/20 0900     Clinical Impression Statement Pt limited some by LB discomfort today.  Will be through moving soon.  Advanced left knee strengthening and stretching along with engaging in gentle LB activities.  She is cued for overall technique, maintaing tight core and working just in minimal pain range, allowing for rest as needed.    Personal Factors and Comorbidities Age;Fitness;Time since onset of injury/illness/exacerbation    Examination-Activity Limitations Locomotion Level;Transfers;Stairs;Stand;Lift    Examination-Participation Restrictions Cleaning;Community Activity;Yard Work;Shop    PT Treatment/Interventions ADLs/Self Care Home Management;Aquatic Therapy;Cryotherapy;Electrical Stimulation;Iontophoresis 4mg /ml Dexamethasone;Moist Heat;Ultrasound;Gait training;Stair training;Functional mobility training;Therapeutic activities;Therapeutic exercise;Balance training;Neuromuscular re-education;Patient/family education;Manual techniques;Passive range of motion;Dry needling;Taping;Vasopneumatic Device;Joint Manipulations             Patient will benefit from skilled therapeutic intervention in order to improve the following deficits and impairments:  Decreased range of motion, Difficulty walking, Pain, Decreased balance, Decreased mobility, Decreased strength, Increased edema, Abnormal gait  Visit Diagnosis: Chronic pain of left knee  Stiffness of left knee, not elsewhere classified  Muscle weakness (generalized)     Problem List Patient Active Problem List   Diagnosis Date Noted   Breast nodule 08/23/2012   Fibroids 10/09/2011    Vedia Pereyra  MPT 08/27/2020, 11:10 AM  [place], [place], [address] Phone: [phone]    Fax:  [phone]  Name: Kathryn Farley MRN: [id number] Date of Birth: 13-Apr-1970

## 2020-08-31 ENCOUNTER — Ambulatory Visit (HOSPITAL_BASED_OUTPATIENT_CLINIC_OR_DEPARTMENT_OTHER): Payer: BC Managed Care – PPO | Admitting: Physical Therapy

## 2020-09-03 ENCOUNTER — Ambulatory Visit (HOSPITAL_BASED_OUTPATIENT_CLINIC_OR_DEPARTMENT_OTHER): Payer: BC Managed Care – PPO | Admitting: Physical Therapy

## 2020-09-07 ENCOUNTER — Encounter (HOSPITAL_BASED_OUTPATIENT_CLINIC_OR_DEPARTMENT_OTHER): Payer: BC Managed Care – PPO | Admitting: Physical Therapy

## 2020-09-10 ENCOUNTER — Encounter (HOSPITAL_BASED_OUTPATIENT_CLINIC_OR_DEPARTMENT_OTHER): Payer: Self-pay | Admitting: Physical Therapy

## 2020-09-10 ENCOUNTER — Other Ambulatory Visit: Payer: Self-pay

## 2020-09-10 ENCOUNTER — Ambulatory Visit (HOSPITAL_BASED_OUTPATIENT_CLINIC_OR_DEPARTMENT_OTHER): Payer: BC Managed Care – PPO | Attending: Family Medicine | Admitting: Physical Therapy

## 2020-09-10 DIAGNOSIS — G8929 Other chronic pain: Secondary | ICD-10-CM | POA: Insufficient documentation

## 2020-09-10 DIAGNOSIS — M6281 Muscle weakness (generalized): Secondary | ICD-10-CM | POA: Insufficient documentation

## 2020-09-10 DIAGNOSIS — M25562 Pain in left knee: Secondary | ICD-10-CM | POA: Diagnosis not present

## 2020-09-10 DIAGNOSIS — M25662 Stiffness of left knee, not elsewhere classified: Secondary | ICD-10-CM | POA: Insufficient documentation

## 2020-09-10 NOTE — Therapy (Addendum)
[place], [place], [address] Phone: [phone]   Fax:  [phone]  Physical Therapy Treatment  Patient Details  Name: Kathryn Farley MRN: [id number] Date of Birth: 01/11/1971 Referring Provider (PT): Gentry Fitz, MD   Encounter Date: 09/10/2020   PT End of Session - 09/10/20 0914     Visit Number 7    Number of Visits 13    Date for PT Re-Evaluation 09/28/20    Authorization Type BCBS 60 visit limit    PT Start Time 0820    PT Stop Time 0905    PT Time Calculation (min) 45 min    Equipment Utilized During Treatment Other (comment)   noodle   Activity Tolerance Patient tolerated treatment well    Behavior During Therapy [facility] for tasks assessed/performed             Past Medical History:  Diagnosis Date   Fibroids     Past Surgical History:  Procedure Laterality Date   TOE SURGERY  1996    There were no vitals filed for this visit.   Subjective Assessment - 09/10/20 0822     Subjective Pt reports she felt good after prior Rx.  She states her knee typically feels good after aquatic Rx and has increased tightness after land rx.  Pt reports her knee is feeling better which may be due to injections and therapy.  Pt reports no specific functional improvements just reduced dull pain overall.  Pt reports her back is feeling better though still has some pain.    Patient Stated Goals Decrease knee pain    Currently in Pain? Yes    Pain Score 4     Pain Location Knee    Pain Orientation Left              -Pt seen for aquatic therapy today.  Treatment took place in water 3.25-4.8 ft in depth at the [facility] pool. Temp of water was 92.  Pt entered/exited the pool via stairs step to pattern independently with bilat rail. -Warm up: forward, backward and side stepping/walking cues for increased step length, increased speed, hand placement to increase resistance. -Pt  performed gastroc stretch at wall 3x20-30 sec, standing hamstrings stretch on step 2x20-30 sec, and glutes 3x20-30 sec sitting on bench of pool. -Knee flex/ext with ankle cuff buoys 2x10 each  -Step ups on bottom step leading with each LE, forward and lateral  x 10 reps each.   -Pt performed LE noodle pushdowns 3 x 10 reps with cuing for tightened abdominals and glutes.  Pt performed Hip abd/add with ankle floats 2x10 reps and standing SLR in flexion 2x10 reps. -Hip flex stretch in standing 3 x 30 se  -Aerobic capacity training Fast water walking forward and back 4 widths each forward and backward.   Pt requires buoyancy for support and to offload joints with strengthening exercises. Viscosity of the water is needed for resistance of strengthening; water current perturbations provides challenge to standing balance unsupported, requiring increased core activation.       PT Education - 09/10/20 1330     Education Details instructed pt in correct form with exercises.  benefits of aquatic therapy.    Person(s) Educated Patient    Methods Explanation;Demonstration;Verbal cues    Comprehension Returned demonstration;Verbalized understanding              PT Short Term Goals - 08/24/20 1548  PT SHORT TERM GOAL #1   Title Pt will be independent with initial HEP    Status Achieved      PT SHORT TERM GOAL #2   Title Pt will have decrease in L knee edema by at least 1 cm    Status Achieved      PT SHORT TERM GOAL #3   Title Pt will be able to tolerate standing on L LE for at least 20 sec to demo improved stability    Baseline resolved trendelenburg but unable to stay stable for 20s    Status On-going               PT Long Term Goals - 07/25/20 1324       PT LONG TERM GOAL #1   Title Pt will be independent with self progressing knee strengthening    Time 6    Period Weeks    Status New    Target Date 09/05/20      PT LONG TERM GOAL #2   Title Pt will have improved L  knee ROM by at least 5 deg for improved function with steps    Time 6    Period Weeks    Status New    Target Date 09/05/20      PT LONG TERM GOAL #3   Title Pt will be able to ascend/descend steps with at least 50% less pain than normal    Time 6    Period Weeks    Status New    Target Date 09/05/20      PT LONG TERM GOAL #4   Title Pt will be able to perform at least 10 squats with 10 lbs to demo improved functional strength    Baseline Unable without compensation    Time 6    Period Weeks    Status New    Target Date 09/05/20                   Plan - 09/10/20 1332     Clinical Impression Statement Pt reports having reduced pain overall and typically feels good after Aquatic Rx's.  Pt states her back is also doing better though still has some pain.  Pt performed exercises well with cuing and instruction for correct form and positioning.  Pt responded well to Rx stating she felt good after Rx and had no increased pain.  Pt should benefit from cont skilled PT services to address ongoing goals and to restore desired level of function.    PT Treatment/Interventions ADLs/Self Care Home Management;Aquatic Therapy;Cryotherapy;Electrical Stimulation;Iontophoresis 56m/ml Dexamethasone;Moist Heat;Ultrasound;Gait training;Stair training;Functional mobility training;Therapeutic activities;Therapeutic exercise;Balance training;Neuromuscular re-education;Patient/family education;Manual techniques;Passive range of motion;Dry needling;Taping;Vasopneumatic Device;Joint Manipulations    PT Next Visit Plan Cont with aquatic and land based PT.  Land visit next Rx.    PT Home Exercise Plan Access Code [email or web address]            Patient will benefit from skilled therapeutic intervention in order to improve the following deficits and impairments:  Decreased range of motion, Difficulty walking, Pain, Decreased balance, Decreased mobility, Decreased strength, Increased edema, Abnormal gait  Visit  Diagnosis: Chronic pain of left knee  Stiffness of left knee, not elsewhere classified  Muscle weakness (generalized)     Problem List Patient Active Problem List   Diagnosis Date Noted   Breast nodule 08/23/2012   Fibroids 10/09/2011    RSelinda MichaelsIII PT, DPT 09/10/20 10:35 PM  PHYSICAL THERAPY DISCHARGE SUMMARY  Visits from Start of Care: 7  Current functional level related to goals / functional outcomes: Unable to assess current functional status and goals due to pt not being present at discharge.  Pt had met STG #1,2 as indicated on last note.    Remaining deficits: See above   Education / Equipment: See above   Pt was seen in PT from 07/25/20 - 09/10/20. She reported having improved pain overall on her last note.  Pt did not schedule any further PT after 09/10/20 and will be considered discharged at this time.    Selinda Michaels III PT, DPT 01/30/21 9:58 AM     [facility] [address] [place], [place], [address] Phone: [phone]   Fax:  [phone]  Name: ANIRA SENEGAL MRN: [id number] Date of Birth: 10/08/1970

## 2020-09-14 ENCOUNTER — Ambulatory Visit (HOSPITAL_BASED_OUTPATIENT_CLINIC_OR_DEPARTMENT_OTHER): Payer: BC Managed Care – PPO | Admitting: Physical Therapy

## 2020-12-05 ENCOUNTER — Ambulatory Visit: Payer: Self-pay | Admitting: Nurse Practitioner

## 2020-12-27 ENCOUNTER — Other Ambulatory Visit: Payer: Self-pay | Admitting: Obstetrics and Gynecology

## 2020-12-27 DIAGNOSIS — N6320 Unspecified lump in the left breast, unspecified quadrant: Secondary | ICD-10-CM

## 2021-01-17 ENCOUNTER — Ambulatory Visit: Payer: BC Managed Care – PPO | Admitting: Nurse Practitioner

## 2021-06-26 ENCOUNTER — Other Ambulatory Visit: Payer: Self-pay | Admitting: Obstetrics and Gynecology

## 2021-06-26 DIAGNOSIS — Z1231 Encounter for screening mammogram for malignant neoplasm of breast: Secondary | ICD-10-CM

## 2021-07-13 ENCOUNTER — Encounter (HOSPITAL_BASED_OUTPATIENT_CLINIC_OR_DEPARTMENT_OTHER): Payer: Self-pay | Admitting: Emergency Medicine

## 2021-07-13 ENCOUNTER — Other Ambulatory Visit: Payer: Self-pay

## 2021-07-13 ENCOUNTER — Emergency Department (HOSPITAL_BASED_OUTPATIENT_CLINIC_OR_DEPARTMENT_OTHER): Payer: BC Managed Care – PPO

## 2021-07-13 ENCOUNTER — Emergency Department (HOSPITAL_BASED_OUTPATIENT_CLINIC_OR_DEPARTMENT_OTHER)
Admission: EM | Admit: 2021-07-13 | Discharge: 2021-07-13 | Disposition: A | Discharge status: Home / Self Care | Payer: BC Managed Care – PPO | Attending: Emergency Medicine | Admitting: Emergency Medicine

## 2021-07-13 DIAGNOSIS — M79661 Pain in right lower leg: Secondary | ICD-10-CM | POA: Diagnosis present

## 2021-07-13 DIAGNOSIS — M7121 Synovial cyst of popliteal space [Baker], right knee: Secondary | ICD-10-CM | POA: Insufficient documentation

## 2021-07-13 MED ORDER — IBUPROFEN 400 MG PO TABS: 400.0000 mg | ORAL_TABLET | Freq: Four times a day (QID) | ORAL | 0 refills | Status: DC | PRN

## 2021-07-13 NOTE — Discharge Instructions (Addendum)
The work-up in the ER reveals that you have Baker's cyst.  This is likely result of knee pathology you have had to the same side.  Baker's cyst often can improve with conservative management such as ibuprofen and cold compresses.  If the symptoms do not get better however, orthopedic doctor asked to reassess you for further management and treatment.  Since you have an orthopedic doctor follow-up coming up in 2 weeks, please discuss this diagnosis if it is still bothering you.

## 2021-07-13 NOTE — ED Provider Notes (Signed)
[facility] EMERGENCY DEPT Provider Note   CSN: [id number] Arrival date & time: 07/13/21  6237     History  Chief Complaint  Patient presents with   Calf Tightness    Kathryn Farley is a 51 y.o. female.  HPI     51 year old female comes in with chief complaint of right-sided calf tenderness.  Patient is about 2 weeks postop from right knee arthrocentesis.  She indicates that about a week ago she started noticing some tightness to the right calf.  Those symptoms have persisted.  Her symptoms are worse with weightbearing.  Review of systems negative for any fevers or chills.  No history of DVT, PE for her.  She does have a sister who had blood clot at young age.  Home Medications Prior to Admission medications   Medication Sig Start Date End Date Taking? Authorizing Provider  ibuprofen (ADVIL) 400 MG tablet Take 1 tablet (400 mg total) by mouth every 6 (six) hours as needed. 07/13/21  Yes Varney Biles, MD  meclizine (ANTIVERT) 12.5 MG tablet Take 1 tablet (12.5 mg total) by mouth 3 (three) times daily as needed for dizziness. Patient not taking: No sig reported 06/02/19   Rayna Sexton, PA-C  Multiple Vitamin (MULTIVITAMIN) capsule Take 1 capsule by mouth daily.    [provider]      Allergies    Fluconazole in dextrose and Metronidazole    Review of Systems   Review of Systems  All other systems reviewed and are negative.  Physical Exam Updated Vital Signs BP 104/66 (BP Location: Right Arm)   Pulse 80   Temp 98.7 F (37.1 C) (Oral)   Resp 16   Ht '5\' 8"'$  (1.727 m)   Wt 77.1 kg   LMP  (LMP Unknown)   SpO2 100%   BMI 25.85 kg/m  Physical Exam Vitals and nursing note reviewed.  Constitutional:      Appearance: She is well-developed.  HENT:     Head: Atraumatic.  Cardiovascular:     Rate and Rhythm: Normal rate.  Pulmonary:     Effort: Pulmonary effort is normal.  Musculoskeletal:        General: No swelling.     Cervical back:  Normal range of motion and neck supple.     Right lower leg: No edema.     Left lower leg: No edema.     Comments: Tenderness over the right proximal calf and mid calf region  Skin:    General: Skin is warm and dry.     Findings: No erythema.  Neurological:     Mental Status: She is alert and oriented to person, place, and time.    ED Results / Procedures / Treatments   Labs (all labs ordered are listed, but only abnormal results are displayed) Labs Reviewed - No data to display  EKG None  Radiology US Venous Img Lower Unilateral Right  Result Date: 07/13/2021 CLINICAL DATA:  Right calf pain.  Right knee arthroscopy 2 weeks ago EXAM: RIGHT LOWER EXTREMITY VENOUS DOPPLER ULTRASOUND TECHNIQUE: Gray-scale sonography with graded compression, as well as color Doppler and duplex ultrasound were performed to evaluate the lower extremity deep venous systems from the level of the common femoral vein and including the common femoral, femoral, profunda femoral, popliteal and calf veins including the posterior tibial, peroneal and gastrocnemius veins when visible. The superficial great saphenous vein was also interrogated. Spectral Doppler was utilized to evaluate flow at rest and with distal augmentation maneuvers  in the common femoral, femoral and popliteal veins. COMPARISON:  None Available. FINDINGS: Contralateral Common Femoral Vein: Respiratory phasicity is normal and symmetric with the symptomatic side. No evidence of thrombus. Normal compressibility. Common Femoral Vein: No evidence of thrombus. Normal compressibility, respiratory phasicity and response to augmentation. Saphenofemoral Junction: No evidence of thrombus. Normal compressibility and flow on color Doppler imaging. Profunda Femoral Vein: No evidence of thrombus. Normal compressibility and flow on color Doppler imaging. Femoral Vein: No evidence of thrombus. Normal compressibility, respiratory phasicity and response to augmentation.  Popliteal Vein: No evidence of thrombus. Normal compressibility, respiratory phasicity and response to augmentation. Calf Veins: No evidence of thrombus. Normal compressibility and flow on color Doppler imaging. Superficial Great Saphenous Vein: No evidence of thrombus. Normal compressibility. Venous Reflux:  None. Other Findings: Elongated complex fluid collection in the popliteal fossa measuring up to 4.4 cm, likely Baker's cyst. IMPRESSION: 1. No evidence of right lower extremity deep venous thrombosis. 2. Elongated complex fluid collection in the popliteal fossa measuring up to 4.4 cm, likely Baker's cyst. Electronically Signed   By: Davina Poke D.O.   On: 07/13/2021 12:29    Procedures Procedures    Medications Ordered in ED Medications - No data to display  ED Course/ Medical Decision Making/ A&P                           Medical Decision Making Risk Prescription drug management.  This patient presents to the ED with chief complaint(s) of calf tenderness with pertinent past medical history of recent arthrocentesis which further complicates the presenting complaint.   No local edema, erythema.  The differential diagnosis considered includes : DVT, Baker's cyst, superficial thrombophlebitis, myositis, cellulitis.  Clinically, there does not appear to be any evidence of infection.  Patient's well score is 1 given the localized right calf tenderness.  Ultrasound ordered.  Additional history obtained: Additional history obtained from spouse   Independent visualization of imaging: - I independently visualized the following imaging with scope of interpretation limited to determining acute life threatening conditions related to emergency care: Ultrasound of the leg, which revealed no evidence of DVT.  Radiology read pending.  Reassessment: Ultrasound results reveal Baker's cyst.  Treatment and Reassessment: Results discussed with the patient.  Baker's cyst risk factor exist  given that she recently had arthrocentesis of the same side.  Still no suspicion for infection.  Will treat with NSAIDs and patient has orthopedic follow-up in 2 weeks when she will discuss her symptoms if still present to see if aggressive intervention is needed.  Final Clinical Impression(s) / ED Diagnoses Final diagnoses:  Synovial cyst of right popliteal space    Rx / DC Orders ED Discharge Orders          Ordered    ibuprofen (ADVIL) 400 MG tablet  Every 6 hours PRN        07/13/21 1300              Varney Biles, MD 07/13/21 1353

## 2021-07-13 NOTE — ED Triage Notes (Signed)
Pt arrives to ED with c/o right calf tightness. Pt reports this started x4 days ago and has worsened. Pt had procedure on right knee x2 weeks ago. She denies swelling in the calf.

## 2021-07-16 ENCOUNTER — Ambulatory Visit: Payer: BC Managed Care – PPO

## 2021-07-23 ENCOUNTER — Ambulatory Visit
Admission: RE | Admit: 2021-07-23 | Discharge: 2021-07-23 | Disposition: A | Discharge status: Home / Self Care | Payer: BC Managed Care – PPO | Source: Ambulatory Visit | Attending: Obstetrics and Gynecology | Admitting: Obstetrics and Gynecology

## 2021-07-23 DIAGNOSIS — Z1231 Encounter for screening mammogram for malignant neoplasm of breast: Secondary | ICD-10-CM

## 2022-03-06 ENCOUNTER — Emergency Department (HOSPITAL_BASED_OUTPATIENT_CLINIC_OR_DEPARTMENT_OTHER): Payer: BC Managed Care – PPO

## 2022-03-06 ENCOUNTER — Encounter (HOSPITAL_BASED_OUTPATIENT_CLINIC_OR_DEPARTMENT_OTHER): Payer: Self-pay | Admitting: *Deleted

## 2022-03-06 ENCOUNTER — Other Ambulatory Visit: Payer: Self-pay

## 2022-03-06 ENCOUNTER — Emergency Department (HOSPITAL_BASED_OUTPATIENT_CLINIC_OR_DEPARTMENT_OTHER)
Admission: EM | Admit: 2022-03-06 | Discharge: 2022-03-06 | Disposition: A | Discharge status: Home / Self Care | Payer: BC Managed Care – PPO | Attending: Emergency Medicine | Admitting: Emergency Medicine

## 2022-03-06 DIAGNOSIS — R202 Paresthesia of skin: Secondary | ICD-10-CM | POA: Insufficient documentation

## 2022-03-06 DIAGNOSIS — S13100A Subluxation of unspecified cervical vertebrae, initial encounter: Secondary | ICD-10-CM | POA: Diagnosis not present

## 2022-03-06 DIAGNOSIS — R519 Headache, unspecified: Secondary | ICD-10-CM | POA: Diagnosis not present

## 2022-03-06 DIAGNOSIS — S199XXA Unspecified injury of neck, initial encounter: Secondary | ICD-10-CM | POA: Diagnosis present

## 2022-03-06 DIAGNOSIS — W01198A Fall on same level from slipping, tripping and stumbling with subsequent striking against other object, initial encounter: Secondary | ICD-10-CM | POA: Diagnosis not present

## 2022-03-06 MED ORDER — KETOROLAC TROMETHAMINE 60 MG/2ML IM SOLN
15.0000 mg | Freq: Once | INTRAMUSCULAR | Status: AC
  Administered 2022-03-06: 15 mg via INTRAMUSCULAR

## 2022-03-06 MED ORDER — KETOROLAC TROMETHAMINE 15 MG/ML IJ SOLN
15.0000 mg | Freq: Once | INTRAMUSCULAR | Status: DC
  Filled 2022-03-06: qty 1

## 2022-03-06 MED ORDER — DIAZEPAM 5 MG PO TABS
5.0000 mg | ORAL_TABLET | Freq: Once | ORAL | Status: AC
  Administered 2022-03-06: 5 mg via ORAL
  Filled 2022-03-06: qty 1

## 2022-03-06 MED ORDER — METHOCARBAMOL 500 MG PO TABS: 500.0000 mg | ORAL_TABLET | Freq: Two times a day (BID) | ORAL | 0 refills | Status: DC

## 2022-03-06 MED ORDER — FENTANYL CITRATE PF 50 MCG/ML IJ SOSY
50.0000 ug | PREFILLED_SYRINGE | INTRAMUSCULAR | Status: DC | PRN
  Administered 2022-03-06: 50 ug via INTRAMUSCULAR

## 2022-03-06 MED ORDER — GADOBUTROL 1 MMOL/ML IV SOLN
7.5000 mL | Freq: Once | INTRAVENOUS | Status: AC | PRN
  Administered 2022-03-06: 7.5 mL via INTRAVENOUS

## 2022-03-06 MED ORDER — HYDROCODONE-ACETAMINOPHEN 5-325 MG PO TABS: 2.0000 | ORAL_TABLET | ORAL | 0 refills | Status: DC | PRN

## 2022-03-06 MED ORDER — NAPROXEN 500 MG PO TABS: 500.0000 mg | ORAL_TABLET | Freq: Two times a day (BID) | ORAL | 0 refills | Status: DC

## 2022-03-06 MED ORDER — FENTANYL CITRATE PF 50 MCG/ML IJ SOSY
50.0000 ug | PREFILLED_SYRINGE | INTRAMUSCULAR | Status: DC | PRN
  Filled 2022-03-06: qty 1

## 2022-03-06 NOTE — Discharge Instructions (Addendum)
MRI of your cervical spine does not show any evidence of spinal cord injury or significant ligamentous injury. Take the medications that are prescribed for symptom management.  We recommend that you call the neurosurgeon at the number provided to ensure you are recovering well from this injury.

## 2022-03-06 NOTE — ED Notes (Signed)
Patient state she hit her head hard enough to cause hyperextension resulting in severe neck pain.  Patient placed in c collar by this EMT

## 2022-03-06 NOTE — ED Notes (Signed)
Pt requests IV to be taken out due to being painful, explained to pt IV may be needed for meds, fluids or other treatments, encouraged pt to leave IV until MRI results to prevent possible additional stick, pt reports "I do not want to be in anymore pain than I'm already in", this RN again advised pt IV line may be used for pain management if meds are ordered, pt states "I don't want any medicine. I just want the IV out", IV removed

## 2022-03-06 NOTE — ED Notes (Signed)
EDP at BS 

## 2022-03-06 NOTE — ED Notes (Signed)
C-collar remains, to wear intermittently periodically as needed for support, and not long term or continuously explained by EDP. Denies questions or needs. Alert, NAD, calm, interactive, steady gait.

## 2022-03-06 NOTE — ED Notes (Signed)
Patient transported to CT 

## 2022-03-06 NOTE — ED Triage Notes (Signed)
BIB family from home s/p fall into a door while going up stairs. Denies LOC. No blood thinners. C/o head, neck, face and bilateral arms/ hands. No meds PTA. C/o pain, and numbness and tingling in arms an hands. C-collar applied in triage.

## 2022-03-06 NOTE — ED Provider Notes (Signed)
[facility] Provider Note   CSN: [id number] Arrival date & time: 03/06/22  0957     History  Chief Complaint  Patient presents with   Kathryn Farley is a 52 y.o. female.  HPI    52 year old female comes in with chief complaint of fall. Patient states that she tripped over a mat, striking her head onto the door.  In the process she hyperextended her neck.  She initially could not use her upper extremity, but was then able to stand up and get help.  She continues to have pain over the proximal part of her upper extremities bilaterally and has tingling sensation in her hands.  She feels slightly weak in her upper extremity as well.  Patient is not on any blood thinners.  She is also complaining of mild to moderate headache.  She does not think she lost consciousness.  Home Medications Prior to Admission medications   Medication Sig Start Date End Date Taking? Authorizing Provider  HYDROcodone-acetaminophen (NORCO/VICODIN) 5-325 MG tablet Take 2 tablets by mouth every 4 (four) hours as needed. 03/06/22  Yes Varney Biles, MD  methocarbamol (ROBAXIN) 500 MG tablet Take 1 tablet (500 mg total) by mouth 2 (two) times daily. 03/06/22  Yes Jaicee Michelotti, MD  naproxen (NAPROSYN) 500 MG tablet Take 1 tablet (500 mg total) by mouth 2 (two) times daily. 03/06/22  Yes Varney Biles, MD  ibuprofen (ADVIL) 400 MG tablet Take 1 tablet (400 mg total) by mouth every 6 (six) hours as needed. Patient not taking: Reported on 03/06/2022 07/13/21   Varney Biles, MD  meclizine (ANTIVERT) 12.5 MG tablet Take 1 tablet (12.5 mg total) by mouth 3 (three) times daily as needed for dizziness. Patient not taking: No sig reported 06/02/19   Rayna Sexton, PA-C      Allergies    Fluconazole in dextrose and Metronidazole    Review of Systems   Review of Systems  All other systems reviewed and are negative.   Physical Exam Updated Vital Signs BP (!)  139/94   Pulse (!) 102   Temp 98.3 F (36.8 C) (Oral)   Resp 16   LMP  (LMP Unknown)   SpO2 100%  Physical Exam Vitals and nursing note reviewed.  Constitutional:      Appearance: She is well-developed.  HENT:     Head: Atraumatic.  Eyes:     Extraocular Movements: Extraocular movements intact.     Pupils: Pupils are equal, round, and reactive to light.  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  Pulmonary:     Effort: Pulmonary effort is normal.  Skin:    General: Skin is warm and dry.  Neurological:     Mental Status: She is alert and oriented to person, place, and time.     Comments: Bilateral upper extremity drift noted, patient has paresthesias over both hands. Lower extremity motor and sensory exam is normal     ED Results / Procedures / Treatments   Labs (all labs ordered are listed, but only abnormal results are displayed) Labs Reviewed - No data to display  EKG None  Radiology MR Cervical Spine W or Wo Contrast  Result Date: 03/06/2022 CLINICAL DATA:  Neck trauma, focal neuro deficit or paresthesia (Age 52-64y) myelopathy vs nurve root impingement suspected EXAM: MRI CERVICAL SPINE WITHOUT AND WITH CONTRAST TECHNIQUE: Multiplanar and multiecho  pulse sequences of the cervical spine, to include the craniocervical junction and cervicothoracic junction, were obtained without and with intravenous contrast. CONTRAST:  7.13m GADAVIST GADOBUTROL 1 MMOL/ML IV SOLN COMPARISON:  Same day cervical spine CT, MRI 11/20/2021. FINDINGS: Alignment: Straightening of the cervical lordosis. Vertebrae: There is no evidence of acute fracture, discitis, or aggressive osseous lesion. There is degenerative endplate edema at CH8-I6 Cord: No abnormal cord signal. Posterior Fossa, vertebral arteries, paraspinal tissues: Minimal prevertebral fluid spanning C3-T1. Disc levels: The craniocervical junction is unremarkable.Intact anterior and  posterior longitudinal ligament. Intact ligamentum flavum. Intact interspinous ligaments without evidence of sprain. C2-C3: Minimal disc bulging and uncovertebral joint hypertrophy. No significant spinal canal or neural foraminal stenosis. C3-C4: Minimal disc bulging with uncovertebral joint hypertrophy and bilateral facet arthropathy.There is minimal spinal canal narrowing and right neural foraminal narrowing. C4-C5: Minimal disc bulging with uncovertebral joint hypertrophy and bilateral facet arthropathy. There is mild spinal canal stenosis.Moderate-severe bilateral neural foraminal stenosis. C5-C6: Mild disc bulging with uncovertebral joint hypertrophy and mild facet arthropathy. There is moderate spinal canal stenosis, moderate right and severe left neural foraminal stenosis. C6-C7: Mild disc bulging with uncovertebral joint hypertrophy and bilateral facet arthropathy. Mild spinal canal stenosis. There is moderate left and minimal right neural foraminal stenosis. C7-T1: Minimal disc bulging with bilateral facet arthropathy. No significant spinal canal or neural foraminal stenosis. IMPRESSION: No evidence of acute traumatic injury in the cervical spine. Minimal prevertebral fluid spanning from C3-T1, nonspecific and favored to be within normal physiologic limits, given there are no other acute findings in the cervical spine. Recommend clinical follow-up and if worsening symptoms consider repeat MRI. Mild spinal canal stenosis at C4-C5 with moderate-severe bilateral neural foraminal stenosis. Moderate spinal canal stenosis at C5-C6 with moderate right and severe left neural foraminal stenosis. Mild spinal canal stenosis with moderate left and minimal right neural foraminal stenosis at C6-C7. Electronically Signed   By: JMaurine SimmeringM.D.   On: 03/06/2022 15:09   CT Cervical Spine Wo Contrast  Result Date: 03/06/2022 CLINICAL DATA:  52year old female status post fall on stairs. Pain. EXAM: CT CERVICAL SPINE  WITHOUT CONTRAST TECHNIQUE: Multidetector CT imaging of the cervical spine was performed without intravenous contrast. Multiplanar CT image reconstructions were also generated. RADIATION DOSE REDUCTION: This exam was performed according to the departmental dose-optimization program which includes automated exposure control, adjustment of the mA and/or kV according to patient size and/or use of iterative reconstruction technique. COMPARISON:  Head CT today. FINDINGS: Alignment: Straightening, mild reversal of cervical lordosis. Cervicothoracic junction alignment is within normal limits. Bilateral posterior element alignment is within normal limits. Skull base and vertebrae: Bone mineralization is within normal limits. Visualized skull base is intact. No atlanto-occipital dissociation. C1 and C2 appear intact and aligned. No acute osseous abnormality identified. Soft tissues and spinal canal: No prevertebral fluid or swelling. No visible canal hematoma. Negative noncontrast visible neck soft tissues. Disc levels: Widespread cervical disc and endplate degeneration CN6-E9through C6-C7. Mild spinal stenosis suspected, especially at C5-C6. Upper chest: Visible upper thoracic levels appear intact. Chronic degeneration at the 1st rib costochondral junction on the right. Negative lung apices, visible noncontrast superior mediastinum. IMPRESSION: 1. No acute traumatic injury identified in the cervical spine. 2. Widespread cervical disc and endplate degeneration with suspected mild spinal stenosis. Electronically Signed   By: HGenevie AnnM.D.   On: 03/06/2022 11:19   CT Head Wo Contrast  Result Date: 03/06/2022 CLINICAL DATA:  52year old female status post fall on stairs. Pain. EXAM:  CT HEAD WITHOUT CONTRAST TECHNIQUE: Contiguous axial images were obtained from the base of the skull through the vertex without intravenous contrast. RADIATION DOSE REDUCTION: This exam was performed according to the departmental  dose-optimization program which includes automated exposure control, adjustment of the mA and/or kV according to patient size and/or use of iterative reconstruction technique. COMPARISON:  None Available. FINDINGS: Brain: Normal cerebral volume. No midline shift, ventriculomegaly, mass effect, evidence of mass lesion, intracranial hemorrhage or evidence of cortically based acute infarction. Gray-white matter differentiation is within normal limits throughout the brain. Vascular: No suspicious intracranial vascular hyperdensity. Skull: No acute osseous abnormality identified. Sinuses/Orbits: Visualized paranasal sinuses and mastoids are clear. Other: Left forehead scalp hematoma, eccentric to the left (series 4, image 16). Underlying frontal bones intact. Hypoplastic frontal sinuses. No scalp soft tissue gas. Visible orbits appear negative. IMPRESSION: 1. Forehead scalp hematoma without underlying skull fracture. 2. Normal noncontrast CT appearance of the brain. Electronically Signed   By: Genevie Ann M.D.   On: 03/06/2022 11:16    Procedures Procedures    Medications Ordered in ED Medications  fentaNYL (SUBLIMAZE) injection 50 mcg (50 mcg Intramuscular Given 03/06/22 1050)  ketorolac (TORADOL) 15 MG/ML injection 15 mg (has no administration in time range)  diazepam (VALIUM) tablet 5 mg (has no administration in time range)  gadobutrol (GADAVIST) 1 MMOL/ML injection 7.5 mL (7.5 mLs Intravenous Contrast Given 03/06/22 1404)    ED Course/ Medical Decision Making/ A&P                             Medical Decision Making This patient presents to the ED with chief complaint(s) of fall and resultant head and neck injury.patient is noted to have significant paresthesias over both hands and proximal upper extremity pain and subjective weakness.  The complaint involves an extensive differential diagnosis and also carries with it a high risk of complications and morbidity.    The differential diagnosis includes  : Cervical spine subluxation, cervical spine ligamentous injury, chronic degenerative spine disease with acute flareup, traumatic myelopathy, traumatic cord syndrome.  The initial plan is to CT scan of the head, C-spine to be ordered.  Afterwards, patient will be reassessed.  If she remains symptomatic then MRI will be ordered.   Additional history obtained: Additional history obtained from spouse   Independent visualization and interpretation of imaging: - I independently visualized the following imaging with scope of interpretation limited to determining acute life threatening conditions related to emergency care: CT scan of the head, which revealed no evidence of brain bleed  Treatment and Reassessment: Patient reassessed after the CT head, C-spine.  She continues to have significant paresthesias.  MRI of the cervical spine ordered.  4:03 PM Upon reassessment, MRI of the cervical spine does not reveal any concerning traumatic injury.  Likely she has spinal shock.  We will treat this with NSAIDs, muscle relaxant and will we will also give her neurosurgery follow-up.  Patient has already removed her collar.  The patient appears reasonably screened and/or stabilized for discharge and I doubt any other medical condition or other [facility] requiring further screening, evaluation, or treatment in the ED at this time prior to discharge.   Results from the ER workup discussed with the patient face to face and all questions answered to the best of my ability. The patient is safe for discharge with strict return precautions.    Problems Addressed: Cervical subluxation, initial encounter: acute illness or  injury with systemic symptoms Injury of neck, initial encounter: undiagnosed new problem with uncertain prognosis  Amount and/or Complexity of Data Reviewed Radiology: ordered.  Risk Prescription drug management.    Final Clinical Impression(s) / ED Diagnoses Final diagnoses:  Injury of  neck, initial encounter  Paresthesia of both hands  Cervical subluxation, initial encounter    Rx / DC Orders ED Discharge Orders          Ordered    naproxen (NAPROSYN) 500 MG tablet  2 times daily        03/06/22 1559    HYDROcodone-acetaminophen (NORCO/VICODIN) 5-325 MG tablet  Every 4 hours PRN        03/06/22 1559    methocarbamol (ROBAXIN) 500 MG tablet  2 times daily        03/06/22 [place], [place], MD 03/06/22 1604

## 2022-07-04 ENCOUNTER — Other Ambulatory Visit: Payer: Self-pay | Admitting: Obstetrics & Gynecology

## 2022-07-04 DIAGNOSIS — Z1231 Encounter for screening mammogram for malignant neoplasm of breast: Secondary | ICD-10-CM

## 2022-07-25 ENCOUNTER — Ambulatory Visit
Admission: RE | Admit: 2022-07-25 | Discharge: 2022-07-25 | Disposition: A | Discharge status: Home / Self Care | Payer: BC Managed Care – PPO | Source: Ambulatory Visit | Attending: Obstetrics & Gynecology | Admitting: Obstetrics & Gynecology

## 2022-07-25 DIAGNOSIS — Z1231 Encounter for screening mammogram for malignant neoplasm of breast: Secondary | ICD-10-CM

## 2022-12-08 ENCOUNTER — Ambulatory Visit (INDEPENDENT_AMBULATORY_CARE_PROVIDER_SITE_OTHER): Payer: BC Managed Care – PPO | Admitting: Certified Nurse Midwife

## 2022-12-08 ENCOUNTER — Encounter (HOSPITAL_BASED_OUTPATIENT_CLINIC_OR_DEPARTMENT_OTHER): Payer: Self-pay | Admitting: Certified Nurse Midwife

## 2022-12-08 ENCOUNTER — Other Ambulatory Visit (HOSPITAL_COMMUNITY)
Admission: RE | Admit: 2022-12-08 | Discharge: 2022-12-08 | Disposition: A | Discharge status: Home / Self Care | Payer: BC Managed Care – PPO | Source: Ambulatory Visit | Attending: Certified Nurse Midwife | Admitting: Certified Nurse Midwife

## 2022-12-08 VITALS — BP 109/70 | HR 72 | Ht 68.0 in | Wt 179.6 lb

## 2022-12-08 DIAGNOSIS — Z86018 Personal history of other benign neoplasm: Secondary | ICD-10-CM

## 2022-12-08 DIAGNOSIS — Z124 Encounter for screening for malignant neoplasm of cervix: Secondary | ICD-10-CM

## 2022-12-08 DIAGNOSIS — Z01419 Encounter for gynecological examination (general) (routine) without abnormal findings: Secondary | ICD-10-CM | POA: Diagnosis not present

## 2022-12-08 DIAGNOSIS — D219 Benign neoplasm of connective and other soft tissue, unspecified: Secondary | ICD-10-CM

## 2022-12-08 DIAGNOSIS — Z809 Family history of malignant neoplasm, unspecified: Secondary | ICD-10-CM

## 2022-12-08 NOTE — Progress Notes (Unsigned)
52 y.o. G51P2002 Married postmenopausal since age 89  Black or [place] American female here for annual exam. She denies postmenopausal spotting or bleeding. Pt prefers annual screening pap smears. Her mother passed from Malignant Mixed Mullerian tumor (type of GYN cancer). Pt reports a history of uterine fibroids.   She lives with her spouse. Has a son and daughter, 1st grandbaby due in March 2025 (our pt Kathryn Farley). Pt doing well emotionally.   Mammogram UTD 07/2022 Pap Smear due Colonoscopy 03/07/2020 Physical Exam 12/05/22 Dr. Everlene Other, Vitamin D 75  No LMP recorded (lmp unknown). Patient is postmenopausal.          Sexually active: Yes.    The current method of family planning is post menopausal status.     Exercising: Yes.     Smoker:  no  Health Maintenance: Pap:  UTD 2023, pt prefers annual screening pap smears due to Family Hx Uterine Cancer in her Mother History of abnormal Pap:  no MMG:  UTD Colonoscopy:  UTD BMD:   n/a Screening Labs: PCP   reports that she has never smoked. She has never used smokeless tobacco. She reports that she does not drink alcohol and does not use drugs.  Past Medical History:  Diagnosis Date   Fibroids     Past Surgical History:  Procedure Laterality Date   TOE SURGERY  1996    Current Outpatient Medications  Medication Sig Dispense Refill   Cholecalciferol 1.25 MG (50000 UT) capsule Take 50,000 Units by mouth daily.     No current facility-administered medications for this visit.    Family History  Problem Relation Age of Onset   Uterine cancer Mother 34       Malignant mixed mullerian cancer   Diabetes Father    Sarcoidosis Father    Stroke Sister 31    ROS: Constitutional: negative Genitourinary:negative  Exam:   BP 109/70 (BP Location: Left Arm, Patient Position: Sitting, Cuff Size: Normal)   Pulse 72   Ht 5\' 8"  (1.727 m)   Wt 179 lb 9.6 oz (81.5 kg)   LMP  (LMP Unknown)   BMI 27.31 kg/m   Height: 5\' 8"  (172.7  cm)  General appearance: alert, cooperative and appears stated age Head: Normocephalic, without obvious abnormality, atraumatic Neck: no adenopathy, supple, symmetrical, trachea midline  Lungs: clear to auscultation bilaterally Breasts: normal appearance, no masses or tenderness, Inspection negative, No nipple retraction or dimpling, No nipple discharge or bleeding, No axillary or supraclavicular adenopathy, Normal to palpation without dominant masses Heart: regular rate and rhythm Abdomen: soft, non-tender; bowel sounds normal; no masses,  no organomegaly Extremities: extremities normal, atraumatic, no cyanosis or edema Skin: Skin color, texture, turgor normal. No rashes or lesions Lymph nodes: Cervical, supraclavicular, and axillary nodes normal. No abnormal inguinal nodes palpated Neurologic: Grossly normal   Pelvic: External genitalia:  no lesions              Urethra:  normal appearing urethra with no masses, tenderness or lesions              Bartholins and Skenes: normal                 Vagina: normal appearing vagina with normal color and no discharge, no lesions              Cervix: multiparous appearance, no bleeding following Pap, no cervical motion tenderness, and no lesions  Pap taken: Yes.   Bimanual Exam:  Uterus:  normal size, contour, position, consistency, mobility, non-tender              Adnexa: no mass, fullness, tenderness               Anus:  normal sphincter tone, no lesions  Chaperone, [name], CMA, was present for exam.  Assessment/Plan:  1. Screening for cervical cancer - Cytology - PAP( [place])  2. Hx of uterine leiomyoma -Follow-up US ordered to evaluate  3. Gynecologic exam normal -Pt prefers annual screening pap smear    4. Family history of cancer in mother (GYN) Malignant mixed mullerian uterine -Pt aware to report any vaginal spotting or bleeding immediately. She will consider RTO for Pelvic GYN [place] due to hx uterine  fibroids.  RTO one year for annual gyn exam and prn if issues arise. [name]

## 2022-12-10 LAB — CYTOLOGY - PAP
Comment: NEGATIVE
Diagnosis: NEGATIVE
High risk HPV: NEGATIVE

## 2023-02-26 ENCOUNTER — Ambulatory Visit (HOSPITAL_BASED_OUTPATIENT_CLINIC_OR_DEPARTMENT_OTHER): Payer: BC Managed Care – PPO | Admitting: Certified Nurse Midwife

## 2023-02-26 ENCOUNTER — Encounter (HOSPITAL_BASED_OUTPATIENT_CLINIC_OR_DEPARTMENT_OTHER): Payer: Self-pay | Admitting: Certified Nurse Midwife

## 2023-02-26 VITALS — BP 117/73 | HR 84 | Ht 68.0 in | Wt 177.0 lb

## 2023-02-26 DIAGNOSIS — B3731 Acute candidiasis of vulva and vagina: Secondary | ICD-10-CM

## 2023-02-26 MED ORDER — TERCONAZOLE 0.4 % VA CREA: 1.0000 | TOPICAL_CREAM | Freq: Every day | VAGINAL | 0 refills | Status: AC

## 2023-02-26 NOTE — Progress Notes (Signed)
   Subjective:     Kathryn Farley is a 53 y.o. female who presents for evaluation of an abnormal vulvar itching/burning. Patient was on Augmentin BID for 10 days from 02/05/23 until 02/15/23. Shortly thereafter she started to experiencing vulvar burning. The area burns "more" when she is in the shower and soap makes contact. Sometimes the itching/burning feels internal as well. She denies vaginal odor. Symptoms present approx 10 days.   amoxicillin-clavulanate (AUGMENTIN) 875-125 mg per tablet  Indications: Sinusitis, unspecified chronicity, unspecified location Take one tablet by mouth 2 (two) times daily for 10 days. 20 tablet    02/05/2023 02/15/2023    The following portions of the patient's history were reviewed and updated as appropriate: allergies, current medications, past family history, past medical history, past social history, past surgical history, and problem list.   Review of Systems Pertinent items are noted in HPI.    Objective:    BP 117/73 (BP Location: Right Arm, Patient Position: Sitting, Cuff Size: Normal)   Pulse 84   Ht 5\' 8"  (1.727 m)   Wt 177 lb (80.3 kg)   LMP  (LMP Unknown)   BMI 26.91 kg/m  General appearance: alert, cooperative, and appears stated age Pelvic:  Mild erythema present bilaterally on labia majora, minora and at vaginal introitus. No discharge noted today.     Assessment:    Monilial vulvo-vaginitis.    Plan:    Pt allergic to PO Diflucan (took Diflucan and Flagyl at same time years ago and developed an allergic reaction to one of those). Will send Rx for Terazol 7. Pt will monitor and will call if symptoms persist despite therapy. [name]

## 2023-09-18 ENCOUNTER — Other Ambulatory Visit: Payer: Self-pay | Admitting: Obstetrics & Gynecology

## 2023-09-18 DIAGNOSIS — Z1231 Encounter for screening mammogram for malignant neoplasm of breast: Secondary | ICD-10-CM

## 2023-10-01 ENCOUNTER — Ambulatory Visit
Admission: RE | Admit: 2023-10-01 | Discharge: 2023-10-01 | Disposition: A | Discharge status: Home / Self Care | Source: Ambulatory Visit | Attending: Obstetrics & Gynecology | Admitting: Obstetrics & Gynecology

## 2023-10-01 DIAGNOSIS — Z1231 Encounter for screening mammogram for malignant neoplasm of breast: Secondary | ICD-10-CM

## 2023-11-20 IMAGING — MG MM DIGITAL SCREENING BILAT W/ TOMO AND CAD
8 series · 8 of 24 positions shown · non-contrast
Comparison: Previous exam(s).

CLINICAL DATA: Screening.

EXAM:
DIGITAL SCREENING BILATERAL MAMMOGRAM WITH TOMOSYNTHESIS AND CAD
TECHNIQUE: Bilateral screening digital craniocaudal and mediolateral oblique
mammograms were obtained. Bilateral screening digital breast
tomosynthesis was performed. The images were evaluated with
computer-aided detection.

[R CC synth-2D]
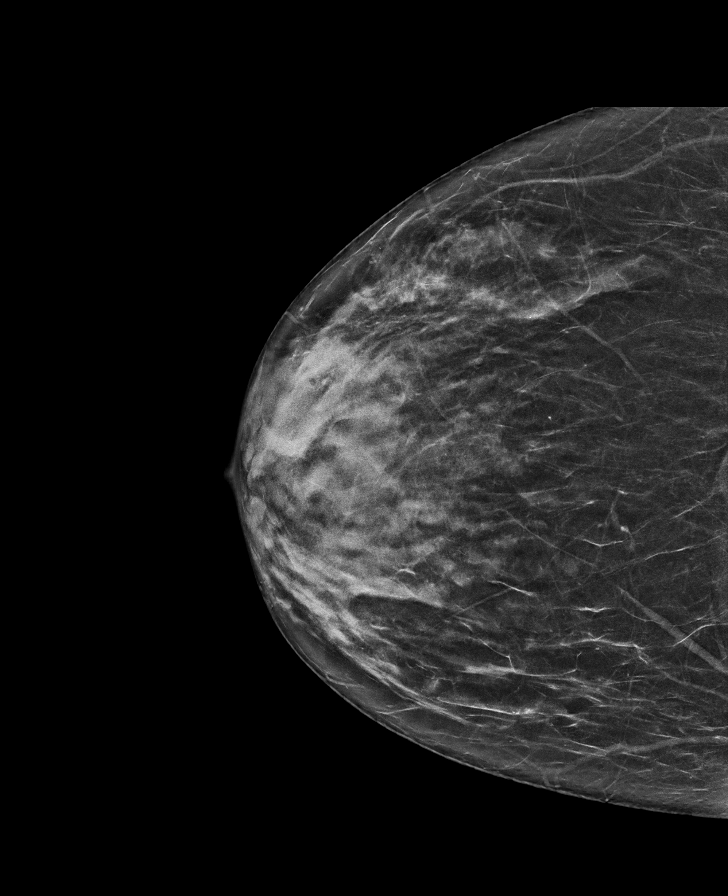

[L MLO synth-2D]
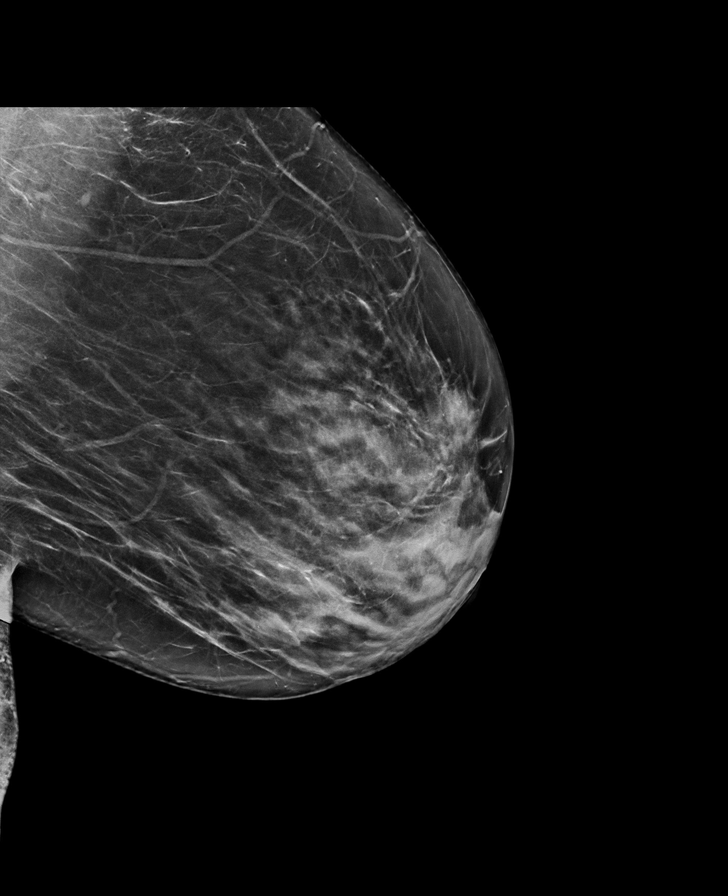

[R MLO synth-2D]
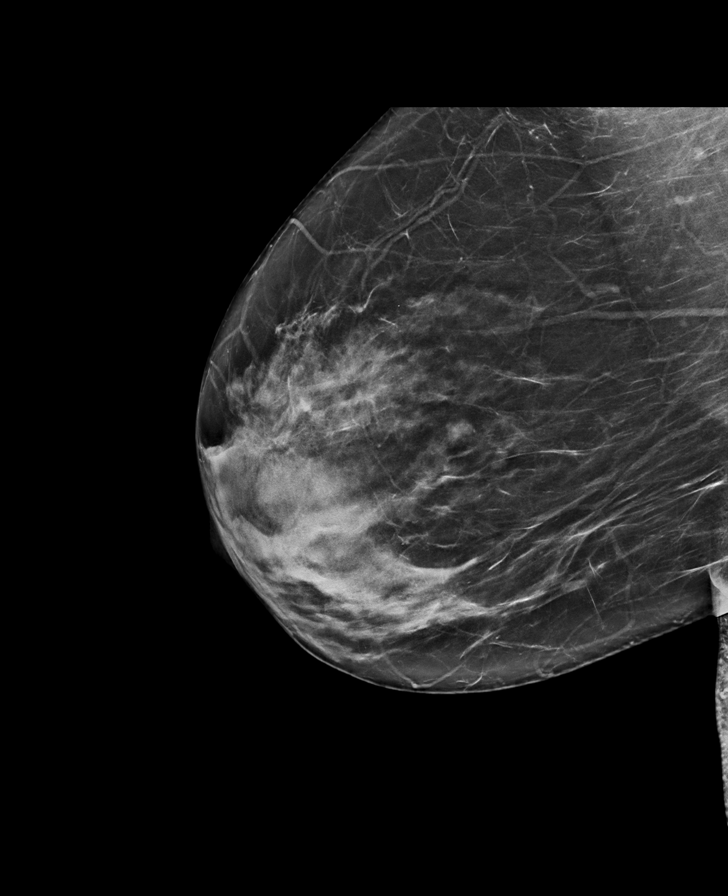

[L CC synth-2D]
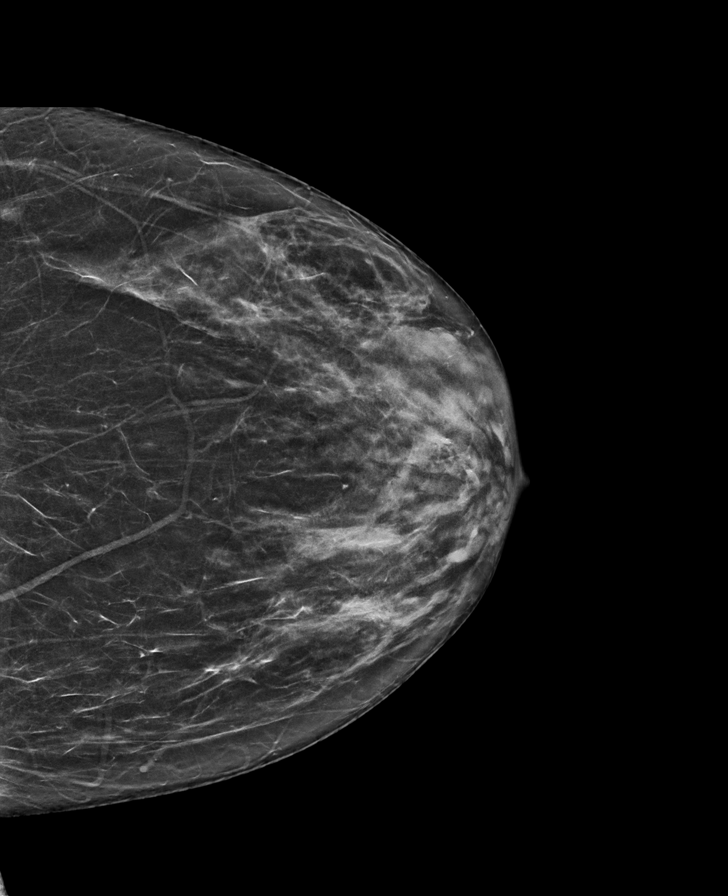

[L CC tomo · tomo slice 34/67.0]
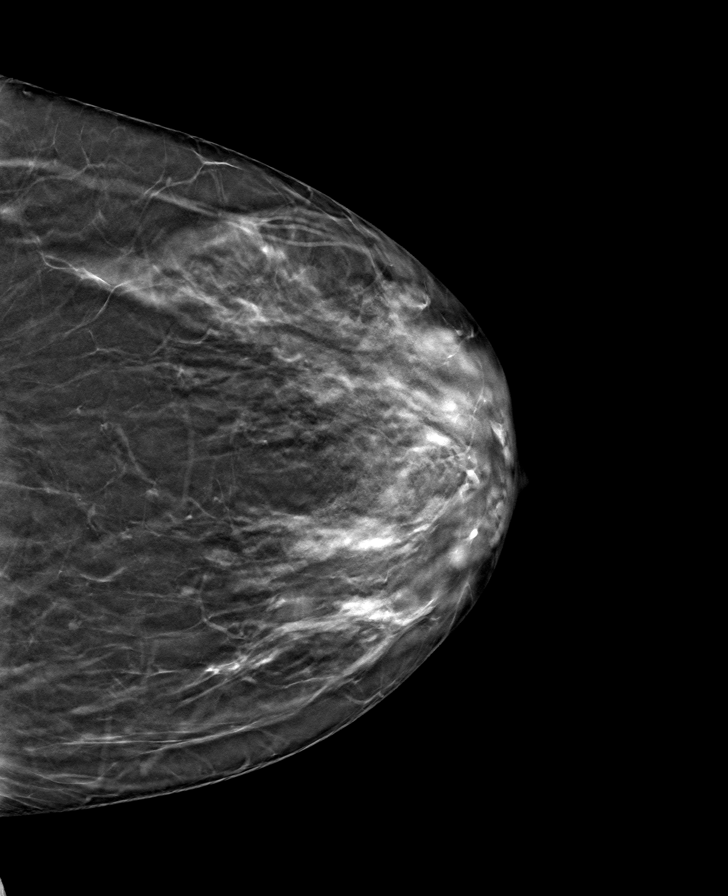

[L MLO tomo · tomo slice 39/78.0]
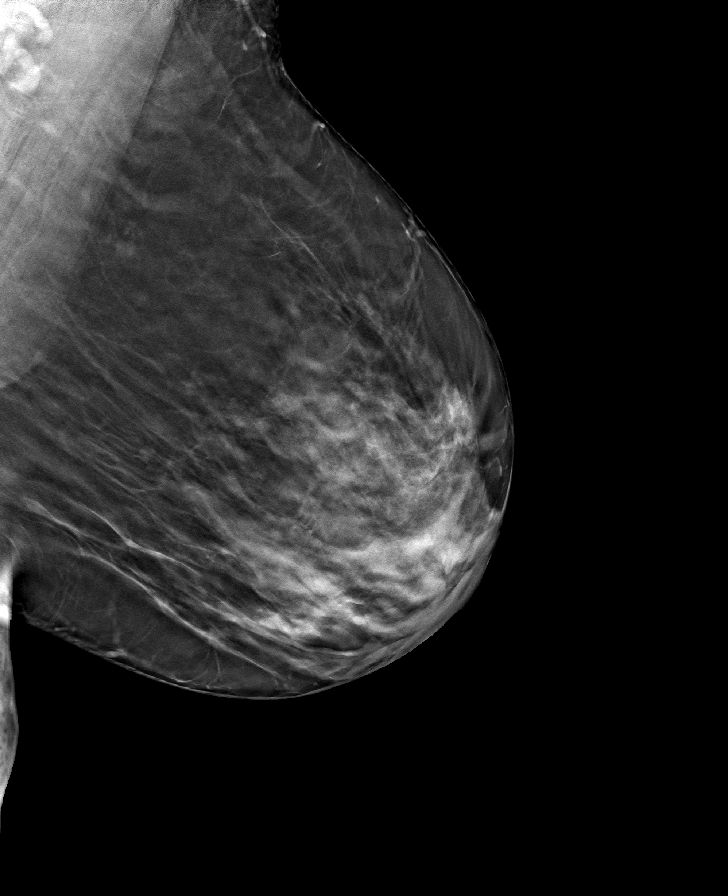

[R MLO tomo · tomo slice 39/77.0]
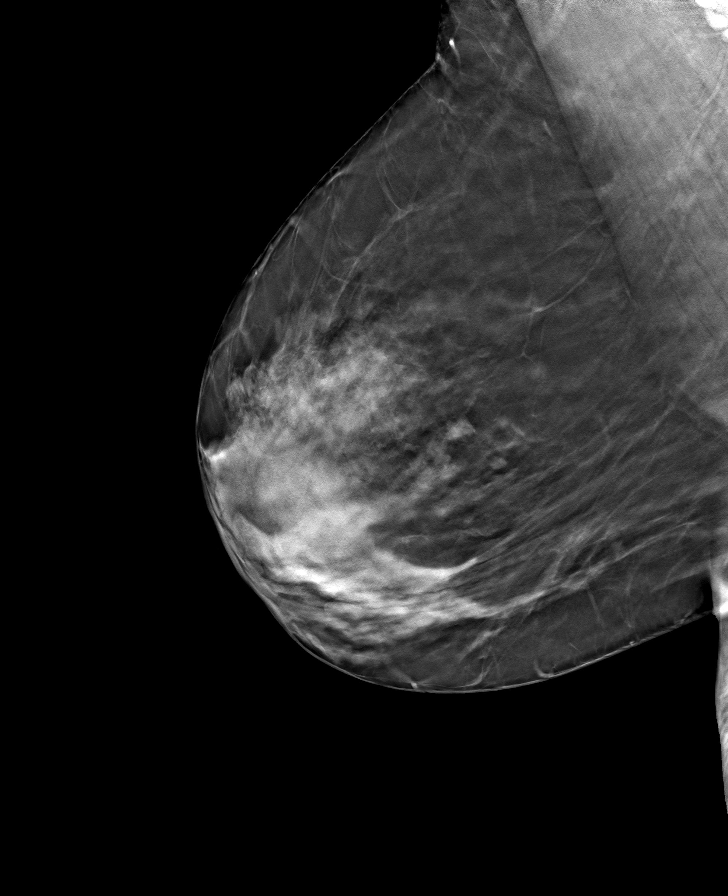

[R CC tomo · tomo slice 34/67.0]
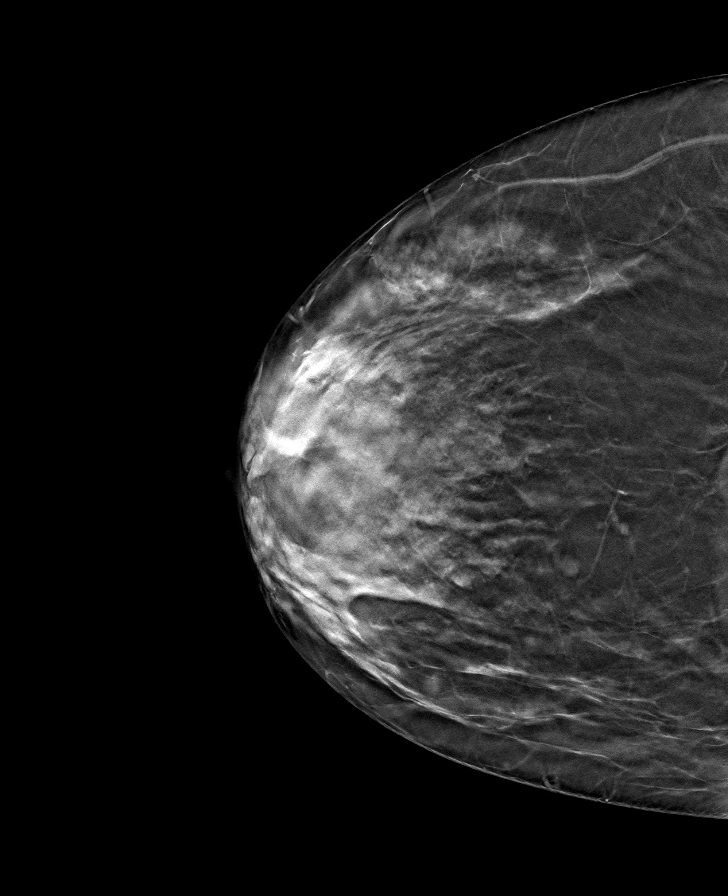

[8 of 24 positions shown; findings below may reference images not displayed]

ACR Breast Density Category c: The breast tissue is heterogeneously
dense, which may obscure small masses.
FINDINGS: There are no findings suspicious for malignancy.
IMPRESSION: No mammographic evidence of malignancy. A result letter of this
screening mammogram will be mailed directly to the patient.

RECOMMENDATION:
Screening mammogram in one year. (Code:Q3-W-BC3)

BI-RADS CATEGORY  1: Negative.

## 2024-01-18 ENCOUNTER — Ambulatory Visit (HOSPITAL_BASED_OUTPATIENT_CLINIC_OR_DEPARTMENT_OTHER): Admitting: Certified Nurse Midwife

## 2024-01-25 ENCOUNTER — Ambulatory Visit (INDEPENDENT_AMBULATORY_CARE_PROVIDER_SITE_OTHER): Admitting: Certified Nurse Midwife

## 2024-01-25 ENCOUNTER — Other Ambulatory Visit (HOSPITAL_COMMUNITY)
Admission: RE | Admit: 2024-01-25 | Discharge: 2024-01-25 | Disposition: A | Discharge status: Home / Self Care | Source: Ambulatory Visit | Attending: Certified Nurse Midwife | Admitting: Certified Nurse Midwife

## 2024-01-25 VITALS — BP 112/80 | HR 93 | Ht 68.0 in | Wt 176.4 lb

## 2024-01-25 DIAGNOSIS — Z8742 Personal history of other diseases of the female genital tract: Secondary | ICD-10-CM

## 2024-01-25 DIAGNOSIS — Z124 Encounter for screening for malignant neoplasm of cervix: Secondary | ICD-10-CM

## 2024-01-25 DIAGNOSIS — Z1151 Encounter for screening for human papillomavirus (HPV): Secondary | ICD-10-CM

## 2024-01-25 DIAGNOSIS — Z01419 Encounter for gynecological examination (general) (routine) without abnormal findings: Secondary | ICD-10-CM

## 2024-01-25 DIAGNOSIS — N852 Hypertrophy of uterus: Secondary | ICD-10-CM

## 2024-01-25 DIAGNOSIS — Z1331 Encounter for screening for depression: Secondary | ICD-10-CM

## 2024-01-25 DIAGNOSIS — Z1283 Encounter for screening for malignant neoplasm of skin: Secondary | ICD-10-CM

## 2024-01-25 NOTE — Progress Notes (Signed)
 53 y.o. G43P2002 Married postmenopausal since age 32  Black or African American female here for annual exam. She denies postmenopausal spotting or bleeding. Pt prefers annual screening pap smears. Her mother passed from Malignant Mixed Mullerian tumor (type of GYN cancer). Pt reports a history of uterine fibroids.     She lives with her spouse. Has a son and daughter, 1st grandchild (grandson) doing well.  Pt doing well emotionally.    No LMP recorded (lmp unknown). Patient is postmenopausal.          Exercising: Yes.     Smoker:  no   Health Maintenance: Pap:  12/08/2022 - NILM w/HPV negative History of abnormal Pap:  no MMG:  10/01/2023 Colonoscopy:  03/07/2020    reports that she has never smoked. She has never used smokeless tobacco. She reports that she does not drink alcohol and does not use drugs.  Past Medical History:  Diagnosis Date   Fibroids     Past Surgical History:  Procedure Laterality Date   TOE SURGERY  1996    Current Outpatient Medications  Medication Sig Dispense Refill   Cholecalciferol 1.25 MG (50000 UT) capsule Take 50,000 Units by mouth daily.     terconazole  (TERAZOL 7 ) 0.4 % vaginal cream Place 1 applicator vaginally at bedtime. 45 g 0   traMADol (ULTRAM) 50 MG tablet Take 50 mg by mouth every 8 (eight) hours as needed.     No current facility-administered medications for this visit.    Family History  Problem Relation Age of Onset   Uterine cancer Mother 31       Malignant mixed mullerian cancer   Diabetes Father    Sarcoidosis Father    Stroke Sister 72    ROS: Constitutional: negative Genitourinary:negative  Exam:   LMP  (LMP Unknown)      General appearance: alert, cooperative and appears stated age Head: Normocephalic, without obvious abnormality, atraumatic Breasts: normal appearance, no masses or tenderness, Inspection negative, No nipple retraction or dimpling, No nipple discharge or bleeding, No axillary or supraclavicular  adenopathy, Normal to palpation without dominant masses Heart: regular rate and rhythm Abdomen: soft, non-tender; bowel sounds normal; no masses,  no organomegaly Extremities: extremities normal, atraumatic, no cyanosis or edema Skin: Skin color, texture, turgor normal. No rashes. Small circular lesion left shoulder (Derm referral) Lymph nodes: Cervical, supraclavicular, and axillary nodes normal. No abnormal inguinal nodes palpated Neurologic: Grossly normal   Pelvic: External genitalia:  no lesions              Urethra:  normal appearing urethra with no masses, tenderness or lesions              Bartholins and Skenes: normal                 Vagina: normal appearing vagina with normal color and no discharge, no lesions              Cervix: no bleeding following Pap, no cervical motion tenderness, and no lesions              Pap taken: Yes.   Bimanual Exam:  Uterus:  uterus mildly enlarged, hx fibroids              Adnexa: no mass, fullness, tenderness               Rectovaginal: Confirms               Anus:  normal sphincter tone, no  lesions  Chaperone,  CMA, was present for exam.  Assessment/Plan:   1. Encounter for annual routine gynecological examination (Primary) - Continue annual screening mammograms and breast self awareness  2. Cervical cancer screening - Cytology - PAP( [place])  3. Enlarged uterus - Hx Fibroids - US  PELVIC COMPLETE WITH TRANSVAGINAL; Future  4. Skin cancer screening - Ambulatory referral to Dermatology   Will plan US  to rule out uterine fibroids (hx fibroids). RTO 1 year for annual gyn exam and prn if issues arise. Arland MARLA Roller

## 2024-01-28 LAB — CYTOLOGY - PAP
Comment: NEGATIVE
Diagnosis: NEGATIVE
High risk HPV: NEGATIVE

## 2024-03-15 NOTE — Progress Notes (Unsigned)
" ° °  GYNECOLOGY  VISIT  CC:   No chief complaint on file.   HPI: 54 y.o. G16P2002 Married Black or African American female here for ultrasound follow up.  No LMP recorded (lmp unknown). Patient is postmenopausal.  Past Medical History:  Diagnosis Date   Fibroids     MEDS:  Reviewed in EPIC  ALLERGIES: Fluconazole in dextrose and Metronidazole  SH:  ***  ROS  PHYSICAL EXAMINATION:    LMP  (LMP Unknown)     General appearance: alert, cooperative and appears stated age Neck: no adenopathy, supple, symmetrical, trachea midline and thyroid {CHL AMB PHY EX THYROID NORM DEFAULT:[phone]::normal to inspection and palpation} CV:  {Exam; heart brief:31539} Lungs:  {pe lungs ob:314451} Breasts: {Exam; breast:13139::normal appearance, no masses or tenderness} Abdomen: soft, non-tender; bowel sounds normal; no masses,  no organomegaly Lymph:  no inguinal LAD noted  Pelvic: External genitalia:  no lesions              Urethra:  normal appearing urethra with no masses, tenderness or lesions              Bartholins and Skenes: normal                 Vagina: {exam; pelvic vaginal:30846}              Cervix: {CHL AMB PHY EX CERVIX NORM [phone]::no lesions}              Bimanual Exam:  Uterus:  {CHL AMB PHY EX UTERUS NORM [phone]::normal size, contour, position, consistency, mobility, non-tender}              Adnexa: {CHL AMB PHY EX ADNEXA NO MASS DEFAULT:[phone]::no mass, fullness, tenderness}              Rectovaginal: {yes no:[id number]}.  Confirms.              Anus:  normal sphincter tone, no lesions  Chaperone was present for exam.  Assessment/Plan: There are no diagnoses linked to this encounter.  "

## 2024-03-16 ENCOUNTER — Ambulatory Visit (INDEPENDENT_AMBULATORY_CARE_PROVIDER_SITE_OTHER)

## 2024-03-16 ENCOUNTER — Encounter (HOSPITAL_BASED_OUTPATIENT_CLINIC_OR_DEPARTMENT_OTHER): Payer: Self-pay | Admitting: Obstetrics & Gynecology

## 2024-03-16 ENCOUNTER — Ambulatory Visit (HOSPITAL_BASED_OUTPATIENT_CLINIC_OR_DEPARTMENT_OTHER): Payer: Self-pay | Admitting: Obstetrics & Gynecology

## 2024-03-16 DIAGNOSIS — N852 Hypertrophy of uterus: Secondary | ICD-10-CM | POA: Diagnosis not present

## 2024-03-16 NOTE — Patient Instructions (Signed)
 Repeat ultrasound with sonohysterography.  Can you go ahead and schedule this?  Thank you.

## 2024-10-11 ENCOUNTER — Ambulatory Visit: Admitting: Physician Assistant
